# Patient Record
Sex: Male | Born: 1955 | Race: White | Hispanic: No | Marital: Married | Smoking: Never smoker
Health system: Southern US, Community
[De-identification: ages and names within clinical notes are randomized; demographics above are authoritative.]

## PROBLEM LIST (undated history)

## (undated) DIAGNOSIS — R05 Cough: Secondary | ICD-10-CM

## (undated) DIAGNOSIS — M79669 Pain in unspecified lower leg: Secondary | ICD-10-CM

## (undated) DIAGNOSIS — M791 Myalgia, unspecified site: Secondary | ICD-10-CM

## (undated) DIAGNOSIS — R233 Spontaneous ecchymoses: Secondary | ICD-10-CM

## (undated) DIAGNOSIS — K219 Gastro-esophageal reflux disease without esophagitis: Secondary | ICD-10-CM

## (undated) DIAGNOSIS — R059 Cough, unspecified: Secondary | ICD-10-CM

## (undated) DIAGNOSIS — R238 Other skin changes: Secondary | ICD-10-CM

## (undated) HISTORY — DX: Other skin changes: R23.8

## (undated) HISTORY — DX: Gastro-esophageal reflux disease without esophagitis: K21.9

## (undated) HISTORY — DX: Cough, unspecified: R05.9

## (undated) HISTORY — DX: Cough: R05

## (undated) HISTORY — DX: Myalgia, unspecified site: M79.10

## (undated) HISTORY — DX: Spontaneous ecchymoses: R23.3

## (undated) HISTORY — DX: Pain in unspecified lower leg: M79.669

## (undated) HISTORY — PX: HERNIA REPAIR: SHX51

---

## 2014-03-30 ENCOUNTER — Ambulatory Visit (INDEPENDENT_AMBULATORY_CARE_PROVIDER_SITE_OTHER): Payer: BC Managed Care – PPO | Admitting: Podiatrist

## 2014-03-30 ENCOUNTER — Encounter: Payer: Self-pay | Admitting: Podiatrist

## 2014-03-30 VITALS — BP 140/101 | HR 91 | Resp 18

## 2014-03-30 DIAGNOSIS — Q828 Other specified congenital malformations of skin: Secondary | ICD-10-CM

## 2014-03-30 DIAGNOSIS — M216X9 Other acquired deformities of unspecified foot: Secondary | ICD-10-CM

## 2014-03-30 NOTE — Patient Instructions (Signed)

## 2014-03-30 NOTE — Progress Notes (Signed)
   Subjective:    Patient ID: Johnny Harris, male    DOB: 08/09/1956, 58 y.o.   MRN: [id number]  HPI  Both my feet have some plantars warts and have been going on for about 6 years and I have used a razor to get them off and they are painful and sore and tender and burning and tingling and shooting pains up my leg and throbs and I have used Dr. Janee MornShull's pads    Review of Systems  HENT: Positive for sinus pressure.   Eyes: Positive for itching.  Musculoskeletal: Positive for gait problem.  Hematological: Bruises/bleeds easily.  All other systems reviewed and are negative.      Objective:   Physical Exam Patient is awake, alert, and oriented x 3.  In no acute distress.  Vascular status is intact with palpable pedal pulses at 2/4 DP and PT bilateral and capillary refill time within normal limits. Neurological sensation is also intact bilaterally via Semmes Weinstein monofilament at 5/5 sites. Light touch, vibratory sensation, Achilles tendon reflex is intact. Dermatological exam reveals skin color, turger and texture as normal. No open lesions present.  Musculature intact with dorsiflexion, plantarflexion, inversion, eversion.  Porokeratotic lesions present with a ground glass appearance and central core submet 5 right, and submet 3 left.  Hyperkeratotic callus present submet 1 left and left and right hallux    Assessment & Plan:  Porokeratotic lesions, plantar flexed metatarsals, bunion left  Plan:  Deep debridement of the lesions was carried out and packed with salinocaine and an aperture pad applied. He will be seen back in 3-4 weeks for continued treatment

## 2014-04-02 ENCOUNTER — Telehealth: Payer: Self-pay | Admitting: *Deleted

## 2014-04-02 NOTE — Telephone Encounter (Signed)
Tried to call patient and their was no answer at home and will try to call monday

## 2014-04-02 NOTE — Telephone Encounter (Signed)
I was up there Tuesday about my feet.  [facility]  I was needing a size 11.  Has she picked those up yet?

## 2014-04-05 NOTE — Telephone Encounter (Signed)
TRIED TO CALL PATIENT AGAIN TODAY AND LEFT A MESSAGE FOR HIM TO CALL AND LET ME KNOW WHEN HE WAS COMING. LISA

## 2014-04-20 ENCOUNTER — Ambulatory Visit (INDEPENDENT_AMBULATORY_CARE_PROVIDER_SITE_OTHER): Payer: BC Managed Care – PPO | Admitting: Podiatrist

## 2014-04-20 ENCOUNTER — Encounter: Payer: Self-pay | Admitting: Podiatrist

## 2014-04-20 VITALS — BP 116/79 | HR 75 | Resp 18

## 2014-04-20 DIAGNOSIS — B079 Viral wart, unspecified: Secondary | ICD-10-CM

## 2014-04-20 MED ORDER — IMIQUIMOD 5 % EX CREA: TOPICAL_CREAM | CUTANEOUS | Status: AC

## 2014-04-20 NOTE — Patient Instructions (Signed)

## 2014-04-20 NOTE — Progress Notes (Signed)
IT HAS COME BACK AND THE INSERTS ARE DOING OK  Patient presents today for followup of lesion sub-left foot and submetatarsal of the right foot. He states the areas have come back in are painful and symptomatic. He also requests pain medication at today's visit.  Objective: Neurovascular status is intact and unchanged. Well-circumscribed lesions are present submetatarsal 3 left foot and submetatarsal right 5.  the areas are painful and symptomatic with direct pressure and with compression. Skin tension lines are absent. They have a waxy type core present with a pinpoint area of bleeding noted bleeding me to suspect that might be verruca  lesions after all.  Assessment: Wart bilateral feet  Plan:: Debrided the areas and applied canthacur and a dressing. Gave him instructions for aftercare and a prescription for Aldara was called into his pharmacy. He was given instructions for its use. I will see him back in one month and we will recheck the lesions at that time.

## 2014-05-11 ENCOUNTER — Encounter: Payer: Self-pay | Admitting: Podiatrist

## 2014-05-11 ENCOUNTER — Ambulatory Visit (INDEPENDENT_AMBULATORY_CARE_PROVIDER_SITE_OTHER): Payer: BC Managed Care – PPO | Admitting: Podiatrist

## 2014-05-11 VITALS — BP 108/80 | HR 78 | Resp 18

## 2014-05-11 DIAGNOSIS — B079 Viral wart, unspecified: Secondary | ICD-10-CM

## 2014-05-11 NOTE — Progress Notes (Signed)
Patient presents today for followup of lesion sub third metatarsal-left foot and submetatarsal fifth of the right foot. He states the areas have come back in are painful and symptomatic. He has been applying the Aldara as instructed.  Objective: Neurovascular status is intact and unchanged. Well-circumscribed lesions are present submetatarsal 3 left foot and submetatarsal right 5. the areas are painful and symptomatic with direct pressure and with compression. Skin tension lines are absent. They have a waxy type core present with a pinpoint area of bleeding noted bleeding me to suspect that might be verruca lesions   Assessment: Wart bilateral feet   Plan:: Debrided the areas and applied canthacur and a dressing. Instructed him to continue using the Aldara. May be a candidate for solaraze gel at the next visit. I will see him back in 4 weeks for followup.

## 2014-06-08 ENCOUNTER — Ambulatory Visit: Payer: BC Managed Care – PPO | Admitting: Podiatrist

## 2014-06-15 ENCOUNTER — Ambulatory Visit (INDEPENDENT_AMBULATORY_CARE_PROVIDER_SITE_OTHER): Payer: BC Managed Care – PPO | Admitting: Podiatrist

## 2014-06-15 ENCOUNTER — Encounter: Payer: Self-pay | Admitting: Podiatrist

## 2014-06-15 VITALS — BP 164/96 | HR 89 | Resp 12

## 2014-06-15 DIAGNOSIS — B079 Viral wart, unspecified: Secondary | ICD-10-CM

## 2014-06-15 NOTE — Progress Notes (Signed)
Patient presents today for followup of lesion sub third metatarsal-left foot and submetatarsal fifth of the right foot. He states the areas of the left foot is improved however the one on the right foot is still painful and symptomatic. He has been applying the Aldara as instructed.  Objective: Neurovascular status is intact and unchanged. Well-circumscribed lesions are present submetatarsal right 5. the areas are painful and symptomatic with direct pressure and with compression. Skin tension lines are absent. They have a waxy type core present with a pinpoint area of bleeding noted bleeding me to suspect that might be verruca lesions  Assessment: Wart right foot Plan:: Debrided the areas and applied canthacur and a dressing. Instructed him to continue using the Aldara.

## 2014-07-27 ENCOUNTER — Ambulatory Visit (INDEPENDENT_AMBULATORY_CARE_PROVIDER_SITE_OTHER): Payer: BC Managed Care – PPO | Admitting: Podiatrist

## 2014-07-27 ENCOUNTER — Encounter: Payer: Self-pay | Admitting: Podiatrist

## 2014-07-27 VITALS — BP 154/85 | HR 82 | Resp 12

## 2014-07-27 DIAGNOSIS — Q828 Other specified congenital malformations of skin: Secondary | ICD-10-CM

## 2014-07-27 DIAGNOSIS — B079 Viral wart, unspecified: Secondary | ICD-10-CM

## 2014-07-27 NOTE — Patient Instructions (Signed)
Salicylic Acid topical gel, cream, lotion, solution What is this medicine? SALICYCLIC ACID (SAL i SIL ik AS id) breaks down layers of thick skin. It is used to treat common and plantar warts, psoriasis, calluses, and corns. It is also used to treat or to prevent acne. This medicine may be used for other purposes; ask your health care provider or pharmacist if you have questions. COMMON BRAND NAME(S): Akurza, Clear Away Liquid, Compound W, Corn/Callus Remover, Dermarest Psoriasis Moisturizer, Dermarest Psoriasis Overnight Treatment, Dermarest Psoriasis Scalp Treatment, Dermarest Psoriasis Skin Treatment, DuoFilm Wart Remover, Gordofilm, Hydrisalic, Keralyt, Neutrogena Acne Wash, Occlusal-HP, RE SA, SalAC, Salactic Film, Salacyn, Salex, Salitop, Scalpicin 2 in 1 Anti-Dandruff, UltraSal-ER, VIRASAL, Wart-Off What should I tell my health care provider before I take this medicine? They need to know if you have any of these conditions: -child with chickenpox, the flu, or other viral infection -kidney disease -liver disease -an unusual or allergic reaction to salicylic acid, other medicines, foods, dyes, or preservatives -pregnant or trying to get pregnant -breast-feeding How should I use this medicine? This medicine is for external use only. Follow the directions on the label. Do not apply to raw or irritated skin. Avoid getting medicine in your eyes, lips, nose, mouth, or other sensitive areas. Use this medicine at regular intervals. Do not use more often than directed. Talk to your pediatrician regarding the use of this medicine in children. Special care may be needed. This medicine is not approved for use in children under 2 years old. Overdosage: If you think you have taken too much of this medicine contact a poison control center or emergency room at once. NOTE: This medicine is only for you. Do not share this medicine with others. What if I miss a dose? If you miss a dose, use it as soon as you  can. If it is almost time for your next dose, use only that dose. Do not use double or extra doses. What may interact with this medicine? -medicines that change urine pH like ammonium chloride, sodium bicarbonate, and others -medicines that treat or prevent blood clots like warfarin -methotrexate -pyrazinamide -some medicines for diabetes -some medicines for gout -steroid medicines like prednisone or cortisone This list may not describe all possible interactions. Give your health care provider a list of all the medicines, herbs, non-prescription drugs, or dietary supplements you use. Also tell them if you smoke, drink alcohol, or use illegal drugs. Some items may interact with your medicine. What should I watch for while using this medicine? Tell your doctor is your symptoms do not get better or if they get worse. This medicine can make you more sensitive to the sun. Keep out of the sun. If you cannot avoid being in the sun, wear protective clothing and use sunscreen. Do not use sun lamps or tanning beds/booths. Use of this medicine in children under 12 years or in patients with kidney or liver disease may increase the risk of serious side effects. These patients should not use this medicine over large areas of skin. If you notice symptoms such as nausea, vomiting, dizziness, loss of hearing, ringing in the ears, unusual weakness or tiredness, fast or labored breathing, diarrhea, or confusion, stop using this medicine and contact your doctor or health care professional. What side effects may I notice from receiving this medicine? Side effects that you should report to your doctor or health care professional as soon as possible: -allergic reactions like skin rash, itching or hives, swelling of the face,   lips, or tongue Side effects that usually do not require medical attention (report to your doctor or health care professional if they continue or are bothersome): -skin irritation This list may not  describe all possible side effects. Call your doctor for medical advice about side effects. You may report side effects to FDA at 1-800-FDA-1088. Where should I keep my medicine? Keep out of the reach of children. Store at room temperature between 15 and 30 degrees C (59 and 86 degrees F). Do not freeze. Throw away any unused medicine after the expiration date. NOTE: This sheet is a summary. It may not cover all possible information. If you have questions about this medicine, talk to your doctor, pharmacist, or health care provider.  2015, [employer]vier/Gold Standard. (2008-06-04 13:36:20)  

## 2014-07-27 NOTE — Progress Notes (Signed)
Patient presents today for followup of lesion sub third metatarsal-left foot and submetatarsal fifth of the right foot. He states the areas of the left foot is improved however the one on the right foot is still painful and symptomatic.   Objective: Neurovascular status is intact and unchanged. Well-circumscribed lesions are present submetatarsal right 5. the areas are painful and symptomatic with direct pressure and with compression. Skin tension lines are absent. They have a waxy type core present with a pinpoint area of bleeding noted bleeding me to suspect that might be verruca lesions.  Hard calluses are present bilateral halluces as well.  Assessment: Wart right foot   Plan:: Debrided the areas and applied canthacur and a dressing. Instructed him to continue using the over-the-counter wart remover acid

## 2014-09-06 ENCOUNTER — Ambulatory Visit (INDEPENDENT_AMBULATORY_CARE_PROVIDER_SITE_OTHER): Payer: BC Managed Care – PPO

## 2014-09-06 VITALS — BP 159/69 | HR 82 | Resp 12

## 2014-09-06 DIAGNOSIS — Q828 Other specified congenital malformations of skin: Secondary | ICD-10-CM

## 2014-09-06 DIAGNOSIS — M79673 Pain in unspecified foot: Secondary | ICD-10-CM

## 2014-09-06 DIAGNOSIS — B070 Plantar wart: Secondary | ICD-10-CM

## 2014-09-06 NOTE — Progress Notes (Signed)
   Subjective:    Patient ID: Johnny Harris, male    DOB: 07/30/1956, 58 y.o.   MRN: [id number]  HPI  ''B/L WARTS CAME BACK AND MAKING FEET HURT.''  Review of Systems no new findings or systemic changes noted     Objective:   Physical Exam This is a 58 year old white male well-developed well-nourished oriented 3 presents at this time with continued pain of keratotic lesion or what appeared be verruca sub-lesion sub-fifth MTP area right third MTP her distal to the MTP area left. Also pinch callus is diffuse keratoses of the first MTP area left. Her fluoroscopic extremity objective findings reveal pat vascular status to be intact pedal pulses palpable DP +2 over 4 bilateral PT one over 4 bilateral Refill time 3 seconds all digits epicritic and proprioceptive sensations intact and symmetric bilateral there is normal plantar response DTRs not noted dermatologic his skin color pigment normal hair growth absent nails criptotic incurvated and friable patient is multiple keratoses particular sub-5 right the keratotic lesion well-developed painful on direct lateral compression well-circumscribed. There is a second lesion sub-third and tarsal digital sulcus area on the left foot also painful on direct lateral compression with well-healed circumscribed border interruption of skin lines consistent with verruca versus porokeratosis. No open wounds no ulcers no secondary infections temperature warm to cool turgor diminished mild edema noted bilateral. Patient is been on carotid treatments that have these debrided pack in the past packed with Cantharone her cancer care patient is also been on Aldara has had continued recurrence and recalcitrant verruca.       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 this time both lesions are debrided and packed with 60% salicylic acid under occlusion for 24 hours a written instructions for topical salicylic acid or  occlusal is given at this time we'll apply daily for 2-3 week duration as instructed to both lesions maintain appropriate accommodative padding and cushioning recheck in one month for further follow-up and he continues to have difficulty make consider mother more invasive options excision curettage laser excisions may need to be discussed if symptoms persist or fail to resolve again this time we use occlusal instruction sheet and use duct tape and salicylic acid daily for 7 up to 21 day regimen of treatment area and tinea if any complications occur any signs of infection discharge or drainage contact us immediately should something like that were to happen  Alvan Dameichard Yannely Kintzel DPM

## 2014-09-06 NOTE — Patient Instructions (Signed)

## 2014-10-04 ENCOUNTER — Ambulatory Visit: Payer: BC Managed Care – PPO

## 2015-07-04 ENCOUNTER — Inpatient Hospital Stay (HOSPITAL_COMMUNITY): Payer: BLUE CROSS/BLUE SHIELD

## 2015-07-04 ENCOUNTER — Inpatient Hospital Stay (HOSPITAL_COMMUNITY)
Admission: EM | Admit: 2015-07-04 | Discharge: 2015-07-16 | DRG: 871 | Disposition: E | Payer: BLUE CROSS/BLUE SHIELD | Source: Other Acute Inpatient Hospital | Attending: Pulmonary Disease | Admitting: Pulmonary Disease

## 2015-07-04 DIAGNOSIS — Z452 Encounter for adjustment and management of vascular access device: Secondary | ICD-10-CM | POA: Insufficient documentation

## 2015-07-04 DIAGNOSIS — K219 Gastro-esophageal reflux disease without esophagitis: Secondary | ICD-10-CM | POA: Diagnosis present

## 2015-07-04 DIAGNOSIS — Z6831 Body mass index (BMI) 31.0-31.9, adult: Secondary | ICD-10-CM | POA: Diagnosis not present

## 2015-07-04 DIAGNOSIS — E669 Obesity, unspecified: Secondary | ICD-10-CM | POA: Diagnosis present

## 2015-07-04 DIAGNOSIS — I509 Heart failure, unspecified: Secondary | ICD-10-CM | POA: Diagnosis not present

## 2015-07-04 DIAGNOSIS — A419 Sepsis, unspecified organism: Secondary | ICD-10-CM | POA: Diagnosis present

## 2015-07-04 DIAGNOSIS — K7200 Acute and subacute hepatic failure without coma: Secondary | ICD-10-CM | POA: Diagnosis present

## 2015-07-04 DIAGNOSIS — R911 Solitary pulmonary nodule: Secondary | ICD-10-CM | POA: Diagnosis present

## 2015-07-04 DIAGNOSIS — G9340 Encephalopathy, unspecified: Secondary | ICD-10-CM | POA: Diagnosis present

## 2015-07-04 DIAGNOSIS — R197 Diarrhea, unspecified: Secondary | ICD-10-CM | POA: Diagnosis not present

## 2015-07-04 DIAGNOSIS — J9601 Acute respiratory failure with hypoxia: Secondary | ICD-10-CM | POA: Diagnosis present

## 2015-07-04 DIAGNOSIS — E43 Unspecified severe protein-calorie malnutrition: Secondary | ICD-10-CM | POA: Diagnosis present

## 2015-07-04 DIAGNOSIS — R0600 Dyspnea, unspecified: Secondary | ICD-10-CM | POA: Diagnosis not present

## 2015-07-04 DIAGNOSIS — F17210 Nicotine dependence, cigarettes, uncomplicated: Secondary | ICD-10-CM | POA: Diagnosis present

## 2015-07-04 DIAGNOSIS — Z66 Do not resuscitate: Secondary | ICD-10-CM | POA: Diagnosis not present

## 2015-07-04 DIAGNOSIS — R6521 Severe sepsis with septic shock: Secondary | ICD-10-CM | POA: Diagnosis present

## 2015-07-04 DIAGNOSIS — I429 Cardiomyopathy, unspecified: Secondary | ICD-10-CM | POA: Diagnosis present

## 2015-07-04 DIAGNOSIS — D65 Disseminated intravascular coagulation [defibrination syndrome]: Secondary | ICD-10-CM | POA: Diagnosis present

## 2015-07-04 DIAGNOSIS — K567 Ileus, unspecified: Secondary | ICD-10-CM | POA: Diagnosis not present

## 2015-07-04 DIAGNOSIS — N170 Acute kidney failure with tubular necrosis: Secondary | ICD-10-CM | POA: Diagnosis present

## 2015-07-04 DIAGNOSIS — Z9911 Dependence on respirator [ventilator] status: Secondary | ICD-10-CM | POA: Diagnosis not present

## 2015-07-04 DIAGNOSIS — E872 Acidosis: Secondary | ICD-10-CM | POA: Diagnosis present

## 2015-07-04 DIAGNOSIS — N179 Acute kidney failure, unspecified: Secondary | ICD-10-CM

## 2015-07-04 DIAGNOSIS — E875 Hyperkalemia: Secondary | ICD-10-CM | POA: Diagnosis present

## 2015-07-04 DIAGNOSIS — J189 Pneumonia, unspecified organism: Secondary | ICD-10-CM | POA: Diagnosis present

## 2015-07-04 DIAGNOSIS — E162 Hypoglycemia, unspecified: Secondary | ICD-10-CM | POA: Diagnosis present

## 2015-07-04 DIAGNOSIS — J9600 Acute respiratory failure, unspecified whether with hypoxia or hypercapnia: Secondary | ICD-10-CM | POA: Diagnosis not present

## 2015-07-04 DIAGNOSIS — I480 Paroxysmal atrial fibrillation: Secondary | ICD-10-CM | POA: Diagnosis present

## 2015-07-04 DIAGNOSIS — Z880 Allergy status to penicillin: Secondary | ICD-10-CM

## 2015-07-04 DIAGNOSIS — J984 Other disorders of lung: Secondary | ICD-10-CM | POA: Diagnosis not present

## 2015-07-04 DIAGNOSIS — N19 Unspecified kidney failure: Secondary | ICD-10-CM | POA: Insufficient documentation

## 2015-07-04 DIAGNOSIS — R34 Anuria and oliguria: Secondary | ICD-10-CM | POA: Diagnosis present

## 2015-07-04 LAB — COMPREHENSIVE METABOLIC PANEL
ALBUMIN: 2.3 g/dL — AB (ref 3.5–5.0)
ALT: 153 U/L — ABNORMAL HIGH (ref 17–63)
ANION GAP: 14 (ref 5–15)
AST: 488 U/L — ABNORMAL HIGH (ref 15–41)
Alkaline Phosphatase: 35 U/L — ABNORMAL LOW (ref 38–126)
BILIRUBIN TOTAL: 2.6 mg/dL — AB (ref 0.3–1.2)
BUN: 31 mg/dL — ABNORMAL HIGH (ref 6–20)
CO2: 20 mmol/L — ABNORMAL LOW (ref 22–32)
Calcium: 6 mg/dL — CL (ref 8.9–10.3)
Chloride: 102 mmol/L (ref 101–111)
Creatinine, Ser: 4.05 mg/dL — ABNORMAL HIGH (ref 0.61–1.24)
GFR calc Af Amer: 17 mL/min — ABNORMAL LOW (ref >60)
GFR calc non Af Amer: 15 mL/min — ABNORMAL LOW (ref >60)
GLUCOSE: 123 mg/dL — AB (ref 65–99)
POTASSIUM: 6.3 mmol/L — AB (ref 3.5–5.1)
SODIUM: 136 mmol/L (ref 135–145)
TOTAL PROTEIN: 4.7 g/dL — AB (ref 6.5–8.1)

## 2015-07-04 LAB — CBC
HEMATOCRIT: 54.6 % — AB (ref 39.0–52.0)
HEMOGLOBIN: 17.6 g/dL — AB (ref 13.0–17.0)
MCH: 31.5 pg (ref 26.0–34.0)
MCHC: 32.2 g/dL (ref 30.0–36.0)
MCV: 97.7 fL (ref 78.0–100.0)
Platelets: 18 10*3/uL — CL (ref 150–400)
RBC: 5.59 MIL/uL (ref 4.22–5.81)
RDW: 16.9 % — ABNORMAL HIGH (ref 11.5–15.5)
WBC: 26.6 10*3/uL — ABNORMAL HIGH (ref 4.0–10.5)

## 2015-07-04 LAB — POCT I-STAT 3, ART BLOOD GAS (G3+)
ACID-BASE DEFICIT: 6 mmol/L — AB (ref 0.0–2.0)
Acid-base deficit: 8 mmol/L — ABNORMAL HIGH (ref 0.0–2.0)
BICARBONATE: 19.9 meq/L — AB (ref 20.0–24.0)
Bicarbonate: 20 mEq/L (ref 20.0–24.0)
O2 SAT: 99 %
O2 Saturation: 99 %
PO2 ART: 168 mmHg — AB (ref 80.0–100.0)
TCO2: 21 mmol/L (ref 0–100)
TCO2: 21 mmol/L (ref 0–100)
pCO2 arterial: 48 mmHg — ABNORMAL HIGH (ref 35.0–45.0)
pCO2 arterial: 47.1 mmHg — ABNORMAL HIGH (ref 35.0–45.0)
pH, Arterial: 7.227 — ABNORMAL LOW (ref 7.350–7.450)
pH, Arterial: 7.247 — ABNORMAL LOW (ref 7.350–7.450)
pO2, Arterial: 189 mmHg — ABNORMAL HIGH (ref 80.0–100.0)

## 2015-07-04 LAB — LACTIC ACID, PLASMA
LACTIC ACID, VENOUS: 18.5 mmol/L — AB (ref 0.5–2.0)
LACTIC ACID, VENOUS: 4.5 mmol/L — AB (ref 0.5–2.0)

## 2015-07-04 LAB — DIC (DISSEMINATED INTRAVASCULAR COAGULATION) PANEL
FIBRINOGEN: 291 mg/dL (ref 204–475)
INR: 2.88 — AB (ref 0.00–1.49)
PROTHROMBIN TIME: 29.7 s — AB (ref 11.6–15.2)

## 2015-07-04 LAB — MRSA PCR SCREENING: MRSA BY PCR: NEGATIVE

## 2015-07-04 LAB — DIC (DISSEMINATED INTRAVASCULAR COAGULATION)PANEL
Platelets: 18 10*3/uL — CL (ref 150–400)
Smear Review: NONE SEEN
aPTT: 51 seconds — ABNORMAL HIGH (ref 24–37)

## 2015-07-04 LAB — GLUCOSE, CAPILLARY
Glucose-Capillary: 48 mg/dL — ABNORMAL LOW (ref 65–99)
Glucose-Capillary: 143 mg/dL — ABNORMAL HIGH (ref 65–99)
Glucose-Capillary: 142 mg/dL — ABNORMAL HIGH (ref 65–99)

## 2015-07-04 LAB — APTT: APTT: 54 s — AB (ref 24–37)

## 2015-07-04 LAB — CORTISOL: Cortisol, Plasma: 68.4 ug/dL

## 2015-07-04 LAB — PROCALCITONIN: Procalcitonin: >175 ng/mL

## 2015-07-04 LAB — PROTIME-INR
INR: 2.89 — AB (ref 0.00–1.49)
PROTHROMBIN TIME: 29.8 s — AB (ref 11.6–15.2)

## 2015-07-04 MED ORDER — SODIUM CHLORIDE 0.9 % FOR CRRT
INTRAVENOUS_CENTRAL | Status: DC | PRN
  Filled 2015-07-04: qty 1000

## 2015-07-04 MED ORDER — SODIUM BICARBONATE 8.4 % IV SOLN
50.0000 meq | Freq: Once | INTRAVENOUS | Status: AC
  Administered 2015-07-04: 50 meq via INTRAVENOUS
  Filled 2015-07-04: qty 50

## 2015-07-04 MED ORDER — DEXTROSE 50 % IV SOLN
50.0000 mL | Freq: Once | INTRAVENOUS | Status: AC
  Administered 2015-07-04: 25 mL via INTRAVENOUS
  Filled 2015-07-04: qty 50

## 2015-07-04 MED ORDER — DEXTROSE 50 % IV SOLN
50.0000 mL | Freq: Once | INTRAVENOUS | Status: AC
  Administered 2015-07-04: 50 mL via INTRAVENOUS
  Filled 2015-07-04: qty 50

## 2015-07-04 MED ORDER — VANCOMYCIN HCL IN DEXTROSE 1-5 GM/200ML-% IV SOLN
1000.0000 mg | Freq: Once | INTRAVENOUS | Status: DC
  Filled 2015-07-04: qty 200

## 2015-07-04 MED ORDER — SODIUM CHLORIDE 0.9 % IV SOLN
Freq: Once | INTRAVENOUS | Status: AC
  Administered 2015-07-04: 14:00:00 via INTRAVENOUS

## 2015-07-04 MED ORDER — DEXTROSE 5 % IV SOLN
500.0000 mg | Freq: Three times a day (TID) | INTRAVENOUS | Status: DC
  Filled 2015-07-04 (×2): qty 0.5

## 2015-07-04 MED ORDER — PANTOPRAZOLE SODIUM 40 MG IV SOLR
40.0000 mg | Freq: Every day | INTRAVENOUS | Status: DC
  Administered 2015-07-04 – 2015-07-05 (×2): 40 mg via INTRAVENOUS
  Filled 2015-07-04 (×2): qty 40

## 2015-07-04 MED ORDER — CALCIUM CHLORIDE 10 % IV SOLN
1.0000 g | Freq: Once | INTRAVENOUS | Status: AC
  Administered 2015-07-04: 1 g via INTRAVENOUS
  Filled 2015-07-04 (×2): qty 10

## 2015-07-04 MED ORDER — ANTISEPTIC ORAL RINSE SOLUTION (CORINZ)
7.0000 mL | Freq: Four times a day (QID) | OROMUCOSAL | Status: DC
  Administered 2015-07-04: 7 mL via OROMUCOSAL

## 2015-07-04 MED ORDER — PRISMASOL BGK 4/2.5 32-4-2.5 MEQ/L IV SOLN
INTRAVENOUS | Status: DC
  Administered 2015-07-04 – 2015-07-05 (×6): via INTRAVENOUS_CENTRAL
  Filled 2015-07-04 (×12): qty 5000

## 2015-07-04 MED ORDER — CHLORHEXIDINE GLUCONATE 0.12% ORAL RINSE (MEDLINE KIT)
15.0000 mL | Freq: Two times a day (BID) | OROMUCOSAL | Status: DC
Start: 2015-07-04 — End: 2015-07-07
  Administered 2015-07-04 – 2015-07-06 (×5): 15 mL via OROMUCOSAL

## 2015-07-04 MED ORDER — SODIUM CHLORIDE 0.9 % IV SOLN: 1500.0000 mg | INTRAVENOUS | Status: DC

## 2015-07-04 MED ORDER — HYDROCORTISONE NA SUCCINATE PF 100 MG IJ SOLR
50.0000 mg | Freq: Four times a day (QID) | INTRAMUSCULAR | Status: DC
  Administered 2015-07-04 – 2015-07-05 (×4): 50 mg via INTRAVENOUS
  Filled 2015-07-04 (×2): qty 1
  Filled 2015-07-04: qty 2
  Filled 2015-07-04: qty 1
  Filled 2015-07-04 (×3): qty 2
  Filled 2015-07-04: qty 1

## 2015-07-04 MED ORDER — DEXTROSE IN LACTATED RINGERS 5 % IV SOLN
INTRAVENOUS | Status: DC
  Administered 2015-07-04: 125 mL/h via INTRAVENOUS
  Administered 2015-07-04: 15:00:00 via INTRAVENOUS

## 2015-07-04 MED ORDER — FENTANYL CITRATE (PF) 100 MCG/2ML IJ SOLN
100.0000 ug | INTRAMUSCULAR | Status: DC | PRN
  Administered 2015-07-04 (×2): 100 ug via INTRAVENOUS
  Filled 2015-07-04 (×2): qty 2

## 2015-07-04 MED ORDER — DEXTROSE 5 % IV SOLN
1.0000 g | Freq: Three times a day (TID) | INTRAVENOUS | Status: DC
  Administered 2015-07-04 – 2015-07-05 (×2): 1 g via INTRAVENOUS
  Filled 2015-07-04 (×4): qty 1

## 2015-07-04 MED ORDER — PRISMASOL BGK 4/2.5 32-4-2.5 MEQ/L IV SOLN
INTRAVENOUS | Status: DC
  Administered 2015-07-04 – 2015-07-05 (×2): via INTRAVENOUS_CENTRAL
  Filled 2015-07-04 (×4): qty 5000

## 2015-07-04 MED ORDER — DEXTROSE 5 % IV SOLN
2.0000 g | Freq: Once | INTRAVENOUS | Status: AC
  Administered 2015-07-04: 2 g via INTRAVENOUS
  Filled 2015-07-04: qty 2

## 2015-07-04 MED ORDER — ALBUTEROL SULFATE (2.5 MG/3ML) 0.083% IN NEBU
2.5000 mg | INHALATION_SOLUTION | RESPIRATORY_TRACT | Status: DC
  Administered 2015-07-04 – 2015-07-06 (×14): 2.5 mg via RESPIRATORY_TRACT
  Filled 2015-07-04 (×14): qty 3

## 2015-07-04 MED ORDER — SODIUM POLYSTYRENE SULFONATE 15 GM/60ML PO SUSP
30.0000 g | Freq: Once | ORAL | Status: AC
  Administered 2015-07-04: 30 g
  Filled 2015-07-04: qty 120

## 2015-07-04 MED ORDER — VANCOMYCIN HCL IN DEXTROSE 1-5 GM/200ML-% IV SOLN
1000.0000 mg | INTRAVENOUS | Status: DC
  Administered 2015-07-05 – 2015-07-06 (×2): 1000 mg via INTRAVENOUS
  Filled 2015-07-04 (×2): qty 200

## 2015-07-04 MED ORDER — LEVOFLOXACIN IN D5W 750 MG/150ML IV SOLN: 750.0000 mg | Freq: Once | INTRAVENOUS | Status: DC

## 2015-07-04 MED ORDER — VASOPRESSIN 20 UNIT/ML IV SOLN
0.0300 [IU]/min | INTRAVENOUS | Status: DC
  Administered 2015-07-04 – 2015-07-06 (×3): 0.03 [IU]/min via INTRAVENOUS
  Filled 2015-07-04 (×4): qty 2

## 2015-07-04 MED ORDER — NOREPINEPHRINE BITARTRATE 1 MG/ML IV SOLN
0.0000 ug/min | INTRAVENOUS | Status: DC
  Administered 2015-07-04: 5 ug/min via INTRAVENOUS
  Administered 2015-07-05: 15 ug/min via INTRAVENOUS
  Filled 2015-07-04 (×4): qty 16

## 2015-07-04 MED ORDER — SODIUM CHLORIDE 0.9 % IV SOLN: Freq: Once | INTRAVENOUS | Status: DC

## 2015-07-04 MED ORDER — DEXTROSE 5 % IV SOLN
0.0000 ug/min | INTRAVENOUS | Status: DC
  Administered 2015-07-04: 20 ug/min via INTRAVENOUS
  Filled 2015-07-04 (×2): qty 4

## 2015-07-04 MED ORDER — PRISMASOL BGK 4/2.5 32-4-2.5 MEQ/L IV SOLN
INTRAVENOUS | Status: DC
  Administered 2015-07-04: 23:00:00 via INTRAVENOUS_CENTRAL
  Filled 2015-07-04 (×2): qty 5000

## 2015-07-04 MED ORDER — INSULIN ASPART 100 UNIT/ML ~~LOC~~ SOLN
10.0000 [IU] | Freq: Once | SUBCUTANEOUS | Status: AC
  Administered 2015-07-04: 10 [IU] via INTRAVENOUS

## 2015-07-04 MED ORDER — SODIUM CHLORIDE 0.9 % IV SOLN
2500.0000 mg | Freq: Once | INTRAVENOUS | Status: AC
  Administered 2015-07-04: 2500 mg via INTRAVENOUS
  Filled 2015-07-04: qty 2500

## 2015-07-04 MED ORDER — FENTANYL CITRATE (PF) 100 MCG/2ML IJ SOLN
100.0000 ug | INTRAMUSCULAR | Status: DC | PRN
  Administered 2015-07-05 (×3): 100 ug via INTRAVENOUS
  Filled 2015-07-04 (×3): qty 2

## 2015-07-04 MED ORDER — DEXTROSE 50 % IV SOLN
INTRAVENOUS | Status: AC
Start: 2015-07-04 — End: 2015-07-05
  Filled 2015-07-04: qty 50

## 2015-07-04 MED ORDER — DEXTROSE 50 % IV SOLN
INTRAVENOUS | Status: AC
  Administered 2015-07-04: 25 mL via INTRAVENOUS
  Filled 2015-07-04: qty 50

## 2015-07-04 MED ORDER — HEPARIN SODIUM (PORCINE) 1000 UNIT/ML DIALYSIS
1000.0000 [IU] | INTRAMUSCULAR | Status: DC | PRN
  Filled 2015-07-04: qty 6

## 2015-07-04 MED ORDER — LEVOFLOXACIN IN D5W 500 MG/100ML IV SOLN: 500.0000 mg | INTRAVENOUS | Status: DC

## 2015-07-04 MED ORDER — VITAMIN K1 10 MG/ML IJ SOLN
10.0000 mg | Freq: Once | INTRAVENOUS | Status: AC
  Administered 2015-07-04: 10 mg via INTRAVENOUS
  Filled 2015-07-04: qty 1

## 2015-07-04 MED ORDER — DEXTROSE 5 % IV SOLN
INTRAVENOUS | Status: DC
  Administered 2015-07-04 – 2015-07-06 (×5): via INTRAVENOUS
  Filled 2015-07-04 (×9): qty 150

## 2015-07-04 MED ORDER — LEVOFLOXACIN IN D5W 750 MG/150ML IV SOLN
750.0000 mg | Freq: Once | INTRAVENOUS | Status: AC
  Administered 2015-07-04: 750 mg via INTRAVENOUS
  Filled 2015-07-04: qty 150

## 2015-07-04 MED ORDER — LEVOFLOXACIN IN D5W 250 MG/50ML IV SOLN
250.0000 mg | INTRAVENOUS | Status: DC
  Filled 2015-07-04: qty 50

## 2015-07-04 NOTE — Procedures (Signed)
Hemodialysis Insertion Procedure Note Johnny Harris [id number] [birth year][employer] Sealsure: Insertion of Hemodialysis Catheter Type: 3 port  Indications: Hemodialysis   Procedure Details Consent: Risks of procedure as well as the alternatives and risks of each were explained to the (patient/caregiver).  Consent for procedure obtained. Time Out: Verified patient identification, verified procedure, site/side was marked, verified correct patient position, special equipment/implants available, medications/allergies/relevent history reviewed, required imaging and test results available.  Performed  Maximum sterile technique was used including antiseptics, cap, gloves, gown, hand hygiene, mask and sheet. Skin prep: Chlorhexidine; local anesthetic administered A antimicrobial bonded/coated triple lumen catheter was placed in the right internal jugular vein using the Seldinger technique. Ultrasound guidance used.Yes.   Catheter placed to 20 cm. Blood aspirated via all 3 ports and then flushed x 3. Line sutured x 2 and dressing applied.  Evaluation Blood flow good Complications: No apparent complications Patient did tolerate procedure well. Chest X-ray ordered to verify placement.  CXR: pending.  Joneen Roach, AGACNP-BC [facility] Pulmonology/Critical Care Pager [phone] or [phone]  08-02-2015 10:17 PM

## 2015-07-04 NOTE — Procedures (Signed)
Arterial Catheter Insertion Procedure Note Haig Gerardo [id number] 1956-01-13  Procedure: Insertion of Arterial Catheter  Indications: Blood pressure monitoring and Frequent blood sampling  Procedure Details Consent: Unable to obtain consent because of emergent medical necessity. Time Out: Verified patient identification, verified procedure, site/side was marked, verified correct patient position, special equipment/implants available, medications/allergies/relevent history reviewed, required imaging and test results available.  Performed  Maximum sterile technique was used including antiseptics, cap, gloves, gown, hand hygiene, mask and sheet. Skin prep: Chlorhexidine; local anesthetic administered 20 gauge catheter was inserted into right radial artery using the Seldinger technique.  Evaluation Blood flow good; BP tracing good. Complications: No apparent complications.   Koren Bound 07/10/2015

## 2015-07-04 NOTE — Procedures (Signed)
Central Venous Catheter Insertion Procedure Note Johnny Harris [id number] 29-Sep-1956  Procedure: Insertion of Central Venous Catheter Indications: Assessment of intravascular volume, Drug and/or fluid administration and Frequent blood sampling  Procedure Details Consent: Unable to obtain consent because of emergent medical necessity. Time Out: Verified patient identification, verified procedure, site/side was marked, verified correct patient position, special equipment/implants available, medications/allergies/relevent history reviewed, required imaging and test results available.  Performed  Maximum sterile technique was used including antiseptics, cap, gloves, gown, hand hygiene, mask and sheet. Skin prep: Chlorhexidine; local anesthetic administered A antimicrobial bonded/coated triple lumen catheter was placed in the left internal jugular vein using the Seldinger technique.  Evaluation Blood flow good Complications: No apparent complications Patient did tolerate procedure well. Chest X-ray ordered to verify placement.  CXR: pending.  U/S used in placement.  YACOUB,WESAM 06/24/2015, 4:16 PM

## 2015-07-04 NOTE — Progress Notes (Signed)
eLink Physician-Brief Progress Note Patient Name: Johnny Harris DOB: 02-Jun-1956 MRN: [id number]   Date of Service  07/01/2015  HPI/Events of Note  Nurse requests Flexiseal.   eICU Interventions  Will order Flexiseal.      Intervention Category Minor Interventions: Routine modifications to care plan (e.g. PRN medications for pain, fever)  Lenell Antu 07/12/2015, 9:13 PM

## 2015-07-04 NOTE — Progress Notes (Signed)
Sputum sample obtained and sent down to main lab without complications.  

## 2015-07-04 NOTE — Consult Note (Signed)
Requesting Physician:  Dr. Molli Knock Reason for Consult:  Acute renal failure, hyperkalemia, lactic acidosis HPI: The patient is a 59 y.o. year-old WM with background of tobacco use, GERD who presented to [facility] with cough, AMS, hypotension. Had felt bad for about a week. Required intubation for resp failure. ATB's started along w/IVF. Imaging studies - bilat pulm infiltrates. D/t progressive hypoxemia, development of diffuse mottling - transferred to [facility] for further management of septic shock, MODS, renal failure, metabolic acidosis, profound thrombocytopenia. Family was informed earlier today of pt's progressive deterioration but unable to deal with issues related to code status so for now pt to receive full measure of support. We are asked to see d/t oligoanuric renal failure, hyperkalemia. Pt currently on pressors, K 6.3, creatinine 4, no UOP. Is on bicarb drip.   Past Medical History  Diagnosis Date  . Muscle pain   . Calf pain     with walking  . Cough   . GERD (gastroesophageal reflux disease)   . Bruises easily      Past Surgical History  Procedure Laterality Date  . Hernia repair      Family History: No family history on file. Social History:  reports that he has never smoked. He has never used smokeless tobacco. He reports that he does not drink alcohol or use illicit drugs.   Allergies  Allergen Reactions  . Penicillins     Childhood allergy    Home medications: Prior to Admission medications   Medication Sig Start Date End Date Taking? Authorizing Provider  naproxen sodium (ANAPROX) 220 MG tablet Take 220 mg by mouth 2 (two) times daily as needed (pain, headache).   Yes Historical Provider, MD  RaNITidine HCl (ACID CONTROL PO) Take 1 tablet by mouth daily as needed (acid reflux).   Yes Historical Provider, MD  imiquimod (ALDARA) 5 % cream Apply topically 3 (three) times a week. Patient not taking: Reported on July 26, 2015 04/20/14   Delories Heinz, DPM     Inpatient medications: . sodium chloride   Intravenous Once  . albuterol  2.5 mg Nebulization Q4H  . antiseptic oral rinse  7 mL Mouth Rinse QID  . aztreonam  500 mg Intravenous 3 times per day  . calcium chloride  1 g Intravenous Once  . chlorhexidine gluconate  15 mL Mouth Rinse BID  . dextrose      . hydrocortisone sod succinate (SOLU-CORTEF) inj  50 mg Intravenous Q6H  . [START ON 07/06/2015] levofloxacin (LEVAQUIN) IV  500 mg Intravenous Q48H  . pantoprazole (PROTONIX) IV  40 mg Intravenous QHS  . phytonadione (VITAMIN K) IV  10 mg Intravenous Once  . [START ON 07/06/2015] vancomycin  1,500 mg Intravenous Q48H   No current facility-administered medications on file prior to encounter.   Current Outpatient Prescriptions on File Prior to Encounter  Medication Sig Dispense Refill  . imiquimod (ALDARA) 5 % cream Apply topically 3 (three) times a week. (Patient not taking: Reported on 2015-07-26) 12 each 0  Infusions  . norepinephrine (LEVOPHED) Adult infusion 20 mcg/min (2015/07/26 1312)  .  sodium bicarbonate  infusion 1000 mL 125 mL/hr at 07/26/2015 1650  . vasopressin (PITRESSIN) infusion - *FOR SHOCK* 0.03 Units/min (July 26, 2015 1415)   Review of Systems Unobtainable as pt intubated  Physical Exam:  BP 149/125 mmHg  Pulse 123  Temp(Src) 103.7 F (39.8 C) (Rectal)  Resp 25  Ht 6' (1.829 m)  Wt 103.4 kg (227 lb 15.3 oz)  BMI 30.91 kg/m2  SpO2 98% Gen: Intubated WM, mottled from mid chest to toes ETT in place Pupils fixed Chest: Regular S1S2 rate 120's Lungs with bilateral coarse rhonchi Abdomen: soft, distended, quiet, mottled Ext: Mottled extremities, cold Neuro: Unresponsive  Labs: Basic Metabolic Panel:  Recent Labs Lab 07/29/2015 1452  NA 136  K 6.3*  CL 102  CO2 20*  GLUCOSE 123*  BUN 31*  CREATININE 4.05*  CALCIUM 6.0*   Liver Function Tests:  Recent Labs Lab 07/29/2015 1452  AST 488*  ALT 153*  ALKPHOS 35*  BILITOT 2.6*  PROT 4.7*  ALBUMIN 2.3*     Recent Labs Lab 07-29-2015 1339 07/29/15 1500  WBC 26.6*  --   HGB 17.6*  --   HCT 54.6*  --   MCV 97.7  --   PLT 18* 18*   Lab Results  Component Value Date   INR 2.88* 07/29/15   INR 2.89* July 29, 2015   Venous lactic acid 18.5 ->4.5 AST 488 ALT 153   Recent Labs Lab 29-Jul-2015 1225  GLUCAP 48*   Results for WALTON, DIGILIO (MRN [id number]) as of 07-29-15 17:32  Ref. Range 2015/07/29 15:00  Platelets Latest Ref Range: 150-400 K/uL 18 (LL)  Smear Review Unknown NO SCHISTOCYTES SEEN  D-Dimer, Quant Latest Ref Range: 0.00-0.48 ug/mL-FEU >20.00 (H)  Fibrinogen Latest Ref Range: 204-475 mg/dL 119  Prothrombin Time Latest Ref Range: 11.6-15.2 seconds 29.7 (H)  INR Latest Ref Range: 0.00-1.49  2.88 (H)  APTT Latest Ref Range: 24-37 seconds 51 (H)   Results for GUSTABO, GORDILLO (MRN [id number]) as of 07/29/15 17:32  Ref. Range 07/29/15 14:51  Procalcitonin Latest Units: ng/mL >175.00   Xrays/Other Studies: US Renal Port  2015/07/29   CLINICAL DATA:  Acute renal failure.  EXAM: RENAL / URINARY TRACT ULTRASOUND COMPLETE  COMPARISON:  None.  FINDINGS: Right Kidney:  Length: 11 cm. Cortical echogenicity and thickness appear within normal limits without mass or cyst. No renal stone or hydronephrosis.  Left Kidney:  Length: 11.7 cm. Cortical echogenicity and thickness appear within normal limits without mass or cyst. No renal stone or hydronephrosis.  Bladder:  Decompressed by Foley catheter.  IMPRESSION: Kidneys appear normal.  No hydronephrosis.  Bladder decompressed by Foley catheter.   Electronically Signed   By: Bary Richard M.D.   On: 07/29/15 16:09   Dg Chest Port 1 View  07-29-15   CLINICAL DATA:  Central line placement.  EXAM: PORTABLE CHEST - 1 VIEW  COMPARISON:  29-Jul-2015  FINDINGS: Endotracheal tube is in place with tip 7.4 cm above carina. Nasogastric tube is in place, tip beyond the imaged and beyond the gastroesophageal junction. Left IJ central line has been  placed, tip overlying the level of superior vena cava.  The heart is enlarged. There is bibasilar opacity, obscuring the medial aspect of the hemidiaphragms bilaterally. There is no evidence for pneumothorax.  IMPRESSION: 1. Interval placement of left IJ central line. 2. Interval placement of nasogastric tube, tip off the film. 3. No evidence for postprocedure pneumothorax.   Electronically Signed   By: Norva Pavlov M.D.   On: 07-29-15 17:03   Dg Chest Port 1 View  2015/07/29   CLINICAL DATA:  59 year old male with pneumonia. Endotracheal tube placement.  EXAM: PORTABLE CHEST - 1 VIEW  COMPARISON:  None  FINDINGS: An endotracheal tube is present with tip 7.2 cm above the carina.  Upper limits normal heart size noted.  Bibasilar atelectasis/airspace disease and possible small pleural effusions noted.  There is no evidence  of pneumothorax or pulmonary edema  No acute bony abnormalities are identified. Remote right-sided rib fractures are present.  IMPRESSION: Upper limits normal heart size with bibasilar atelectasis/airspace disease possible small pleural effusions.  Endotracheal tube with tip 7.2 cm above carina. Consider 1-2 cm advancement.   Electronically Signed   By: Harmon Pier M.D.   On: 2015/07/08 14:07   Background:  59 yo WM with minimal PMH (smoker, GERD) with  illness of rather short duration with septic shock in the setting of bilateral PNA, resp failure, and now oliguric renal failure, hyperkalemia, lactic acidosis, thrombocytopenia, coagulopathy,  who requires acute renal replacement therapy. Baseline creatinine unknown (1.5 on arrival to [facility] 9/18).   Impression/Plan 1. AKI - septic/hemodynamic AKI with probable ATN. Hyperkalemic and acidotic. Will require CRRT. CCM to place catheter after plt/FFP support. Keep vol even, all 4K fluids, no anticoagulation.   2. Metabolic acidosis - Peripheral bicarb drip can be adjusted by CCM. 3. Hypocalcemia - IV Ca (CCM has ordered) 4. Septic shock -  presumed d/t bilateral PNA. Pressors/steroids/ATB's per CCM 5. VDRF - per CCM 6. Abnl LFT's - presume shock liver 7. Sepsis induced severe thrombocytopenia - plts for lines. FFP. Trending CBC. 8. Coagulopathy - concern for DIC 9. Hypoglycemia - D50/glucose containing fluids. Cortisol pending.  Camille Bal,  MD [facility] [phone] pager 07/08/2015, 5:26 PM

## 2015-07-04 NOTE — Progress Notes (Signed)
The rest of the family arrived, patient has been deteriorating all day.  With wife, daughters and rest the family in the waiting room.  Unfortunately rooms were either locked or in use so conversation took place in the corner of the waiting room.  The patient is visibly deteriorating and I sat with family informed them of the extent of the organ failure and the noticeable deterioration.  We began discussing the code status but of the daughters started screaming in grief (which is perfectly understandable) and the structure of the meeting began to breakdown with grief (which again is perfectly understandable) so I made the choice to give them some time to discuss this amongst themselves and will revisit the topic at a later time.  For now ill continue full code status.  The patient is critically ill with multiple organ systems failure and requires high complexity decision making for assessment and support, frequent evaluation and titration of therapies, application of advanced monitoring technologies and extensive interpretation of multiple databases.   Critical Care Time devoted to patient care services described in this note is  45  Minutes. This time reflects time of care of this signee Dr Wesam Yacoub. This criticKoren Boundime does not reflect procedure time, or teaching time or supervisory time of PA/NP/Med student/Med Resident etc but could involve care discussion time.  Alyson Reedy, M.D. [facility] Pulmonary/Critical Care Medicine. Pager: [phone]. After hours pager: [phone].

## 2015-07-04 NOTE — Progress Notes (Addendum)
ANTIBIOTIC CONSULT NOTE - INITIAL  Pharmacy Consult for vancomycin, levofloxacin, and aztreonam Indication: sepsis  Allergies  Allergen Reactions  . Penicillins     Childhood allergy    Patient Measurements: Height: 6' (182.9 cm) Weight: 227 lb 15.3 oz (103.4 kg) IBW/kg (Calculated) : 77.6   Vital Signs: Temp: 103.7 F (39.8 C) (09/19 1217) Temp Source: Rectal (09/19 1217) BP: 149/125 mmHg (09/19 1530) Pulse Rate: 123 (09/19 1608) Intake/Output from previous day:   Intake/Output from this shift: Total I/O In: 897.5 [I.V.:247.5; IV Piggyback:650] Out: -   Labs:  Recent Labs  07/09/2015 1339 07/06/2015 1452 07/02/2015 1500  WBC 26.6*  --   --   HGB 17.6*  --   --   PLT 18*  --  PENDING  CREATININE  --  4.05*  --    Estimated Creatinine Clearance: 23.8 mL/min (by C-G formula based on Cr of 4.05). No results for input(s): VANCOTROUGH, VANCOPEAK, VANCORANDOM, GENTTROUGH, GENTPEAK, GENTRANDOM, TOBRATROUGH, TOBRAPEAK, TOBRARND, AMIKACINPEAK, AMIKACINTROU, AMIKACIN in the last 72 hours.   Microbiology: Recent Results (from the past 720 hour(s))  MRSA PCR Screening     Status: None   Collection Time: 06/25/2015  2:26 PM  Result Value Ref Range Status   MRSA by PCR NEGATIVE NEGATIVE Final    Comment:        The GeneXpert MRSA Assay (FDA approved for NASAL specimens only), is one component of a comprehensive MRSA colonization surveillance program. It is not intended to diagnose MRSA infection nor to guide or monitor treatment for MRSA infections.    Assessment: 13 YOM originally seen at [facility] on 9/18 with <1day history of AMS. Since then, has developed respiratory failure, acidosis and renal failure. Transferred to [facility] today after being intubated. SCr at [facility] was 1.5, now up to 4. eCrCl ~20-83mL/min.  Received vancomycin  IV x1, levofloxacin  IV x1 and aztreonam 2g IV, all were given ~1400 this afternoon.  Goal of Therapy:   Vancomycin trough level 15-20 mcg/ml  Plan:  -vancomycin  IV q48h starting 9/21 at 1400 -levofloxacin  IV q48h starting 9/21 at 1400 -aztreonam  IV q8h starting tonight at 2200 -follow renal function/renal plans, clinical progression, c/s  Lauren D. Bajbus, PharmD, BCPS Clinical Pharmacist Pager: [phone] 07/10/2015 4:26 PM  ADDENDUM Dr. Eliott Nine has seen and CRRT is to start. Antibiotic dosing will need to be adjusted. Orders are entered, anticipate CRRT to start ~1900  Plan: -vancomycin  ( /kg) IV q24h starting 9/20 at 2200- a little longer than 24h on CRRT since he received a /kg loading dose -levofloxacin  IV q24h starting 9/20 at 2000 -aztreonam 1g IV q8h for CVVHD/CVVHDF dosing starting tonight at 2200  Lauren D. Bajbus, PharmD, BCPS Clinical Pharmacist Pager: [phone] 07/03/2015 5:51 PM

## 2015-07-04 NOTE — H&P (Signed)
PULMONARY / CRITICAL CARE MEDICINE   Name: Johnny Harris MRN: [id number] DOB: Oct 23, 1955    ADMISSION DATE:  07/08/2015   REFERRING MD :  [facility]   CHIEF COMPLAINT:  Septic shock   INITIAL PRESENTATION:  59 year old male initially presented to [facility] on 9/18 w/ <1d h/o AMS. To be admitted w/ working dx of septic shock in setting of presumed PNA. Developed worsening LOC, respiratory failure, acidosis and renal failure. Transferred to [facility] after being intubated. On arrival working dx of: CAP-->resp failure; septic shock-->MODS-->renal failure, metabolic acidosis and profound thrombocytopenia.   STUDIES:  CT chest 9/19: bilateral infiltrates R>L, 2.7 cm  opacity right lung base.  CT abd/pelvis 9/19: negative for obstruction. No other acute findings. Except delayed renal nephrograms raising concern about either poor renal fxn vs pyelo   SIGNIFICANT EVENTS:    HISTORY OF PRESENT ILLNESS:   This is a 59 year old white male who works as a Consulting civil engineer for a [employer]. Smokes 2 ppd. Was in usual state of health until the am of 9/19. Per family he went to bed the night prior at about 8pm. Had cough but no different than baseline. Found by family w/ altered mental status and brought to the ER. On arrival found to be confused and hypotensive. Diagnostics included: CT chest, abd and head (results as above), blood cultures, IV fluid resuscitation and empiric rocephin and azithromycin. He was to be admitted to the ICU. Over the course of the evening initially his mental status improved some and the ER MD felt that the pt may have been feeling poorly for about a week leading up to the event. While in ER he was persistently hypotensive, became progressively mottled and diffusely ecchymotic. He was intubated for worsening mental status and concern for airway protection, got progressively hypoxic w/ P/F ratio of 189. [facility]   PAST MEDICAL HISTORY :    has a past medical history of Muscle pain; Calf pain; Cough; GERD (gastroesophageal reflux disease); and Bruises easily.  has past surgical history that includes Hernia repair. Prior to Admission medications   Medication Sig Start Date End Date Taking? Authorizing Provider  imiquimod (ALDARA) 5 % cream Apply topically 3 (three) times a week. 04/20/14   Delories Heinz, DPM   No Known Allergies  FAMILY HISTORY:  has no family status information on file.  SOCIAL HISTORY:  reports that he has never smoked. He has never used smokeless tobacco. He reports that he does not drink alcohol or use illicit drugs.  REVIEW OF SYSTEMS:  Unable   SUBJECTIVE:  Critically ill  VITAL SIGNS: Temp:  [103.7 F (39.8 C)] 103.7 F (39.8 C) (09/19 1217) Pulse Rate:  [122] 122 (09/19 1217) Resp:  [20] 20 (09/19 1217) BP: (94)/(82) 94/82 mmHg (09/19 1217) SpO2:  [99 %] 99 % (09/19 1217) FiO2 (%):  [70 %-100 %] 70 % (09/19 1310) Weight:  [103.4 kg (227 lb 15.3 oz)] 103.4 kg (227 lb 15.3 oz) (09/19 1311) HEMODYNAMICS:   VENTILATOR SETTINGS: Vent Mode:  [-] PRVC FiO2 (%):  [70 %-100 %] 70 % Set Rate:  [20 bmp-24 bmp] 24 bmp Vt Set:  [086 mL] 620 mL PEEP:  [5 cmH20] 5 cmH20 Plateau Pressure:  [16 cmH20] 16 cmH20 INTAKE / OUTPUT: No intake or output data in the 24 hours ending 06/24/2015 1326  PHYSICAL EXAMINATION: General:  Terminally ill appearing white male who is diffusely mottled and ecchymotic over  entire body  Neuro:  GCS 3 w/ no cough or gag  HEENT:  Pupils are fixed and pinpoint, mucous membranes are dry, poor dentition, no JVD  Cardiovascular:  rrr Lungs:  Scattered rhonchi, crackles in both bases  Abdomen:  Soft, hypoactive  Musculoskeletal:  Diffusely weak, + tone.  Skin:  Diffuse mottling and ecchymosis   LABS:  CBC No results for input(s): WBC, HGB, HCT, PLT in the last 168 hours. Coag's No results for input(s): APTT, INR in the last 168 hours. BMET No results for input(s):  NA, K, CL, CO2, BUN, CREATININE, GLUCOSE in the last 168 hours. Electrolytes No results for input(s): CALCIUM, MG, PHOS in the last 168 hours. Sepsis Markers No results for input(s): LATICACIDVEN, PROCALCITON, O2SATVEN in the last 168 hours. ABG  Recent Labs Lab 07/12/2015 1259  PHART 7.227*  PCO2ART 48.0*  PO2ART 189.0*   Liver Enzymes No results for input(s): AST, ALT, ALKPHOS, BILITOT, ALBUMIN in the last 168 hours. Cardiac Enzymes No results for input(s): TROPONINI, PROBNP in the last 168 hours. Glucose  Recent Labs Lab 07/11/2015 1225  GLUCAP 48*    Imaging No results found. R>L airspace disease.  Bedside US performed on 9/19 was negative for effusions.   ASSESSMENT / PLAN:  PULMONARY OETT /19>>> A: Acute hypoxic respiratory failure (P/F ration 189) in setting of bilateral CAP Right basilar pulmonary nodule P:   Full vent support PAD protocol Scheduled BDs See ID section  F/u ct imaging of chest 3-6 months if survives  CARDIOVASCULAR CVL left femoral CVL 9/19>>> A:  Septic Shock/MODS PAF w/ RVR P:  Cont LR for MIVFs Levophed and vasopressin for MAP >65 Will need IJ for CVP monitoring to a/w guiding fluids  Send stat cortisol Add stress dose steroids  No anticoagulation for now given thrombocytopenia   RENAL A:   AKI (scr on admit 1.5) Lactic acidosis   P:   Repeat chemistry and serial lactates.  Renal dose meds Strict I&O  GASTROINTESTINAL A:   Obesity  Likely critical illness related severe malnutrition  P:   PPI for SUP Nutritional consult w/ early tubefeeds   HEMATOLOGIC A:   Sepsis induced severe thrombocytopenia  Leukocytosis  Mild coagulopathy  P:  Trend CBC  PAS Transfuse platelets for planned central line  Send DIC panel Transfuse per usual ICU protocol   INFECTIOUS A:   CAP (NOS) Septic shock PCN allergy  P:   BCx2 9/18 ([place])>>> UC 9/18 ([facility])>> Sputum 9/19>>> BC 9/19>>> U strep 9/19>> u legionella  9/19>>> 9/18 azithro & Ceftriaxone X 1 9/19 vanc >>> 9/19 levaquin 9/19>>> 9/19 azactam 9/19>>> 9/19 RVP>>> 9/19 rapid flu >>>  ENDOCRINE A:   Hypoglycemia  R/o adrenal dysfxn P:   Stat random cortisol  cbg q 1 hr, then q 4 D50 given  Add Dextrose to MIVF  NEUROLOGIC A:   Acute encephalopathy in setting of sepsis Consider meningitis on d-dx but no report of nuchal rigidity or changes prior   >unsure of how much the versed gtt contributing.  CT head at [facility] w/out acute findings  P:   RASS goal: -2 D/c versed gtt Change to PRN fentanyl   FAMILY  - Updates: I have updated his wife and two daughters. At this point he is full code, but if things worsen (or if he were to arrest in spite of current measures) we would need to revisit this   - Inter-disciplinary family meet or Palliative Care meeting due by: 9/26  TODAY'S SUMMARY: 59 year old male initially presented to [facility] ER on 9/18 w/ <1d h/o AMS. To be admitted w/ working dx of septic shock in setting of presumed PNA. Developed worsening LOC, respiratory failure, acidosis and renal failure. Transferred to [facility] after being intubated. On arrival working dx of: CAP-->resp failure; septic shock-->MODS-->renal failure, metabolic acidosis and profound thrombocytopenia. I am concerned that his is rapidly failing w/ progressive MODS. Symptoms worrisome for pneumococcal.   Simonne Martinet ACNP-BC [facility] Pulmonary/Critical Care Pager # [phone] OR # [phone] if no answer   06/25/2015, 1:26 PM  Attending Note:  59 year old male with no significant PMH who presents to the hospital with altered mental status.  Progressed overtime to ARDS with acute respiratory failure, acute renal failure and septic shock.  Patient was transferred to [facility] for further management and need for renal to see patient.  Patient is profoundly hypoxemic and in septic shock.  He is mottled diffusely on exam.  Concern for DIC as well. Patient carries a  very high mortality.  Family wishes for full code status for now.  The patient is critically ill with multiple organ systems failure and requires high complexity decision making for assessment and support, frequent evaluation and titration of therapies, application of advanced monitoring technologies and extensive interpretation of multiple databases.   Critical Care Time devoted to patient care services described in this note is  60  Minutes. This time reflects time of care of this signee Dr Koren Bound. This critical care time does not reflect procedure time, or teaching time or supervisory time of PA/NP/Med student/Med Resident etc but could involve care discussion time.  Alyson Reedy, M.D. [facility] Pulmonary/Critical Care Medicine. Pager: [phone]. After hours pager: [phone].

## 2015-07-04 NOTE — Progress Notes (Signed)
Patient received from [facility] and placed on ventilator without any complications.  Will continue to monitor.

## 2015-07-05 ENCOUNTER — Inpatient Hospital Stay (HOSPITAL_COMMUNITY): Payer: BLUE CROSS/BLUE SHIELD

## 2015-07-05 ENCOUNTER — Encounter (HOSPITAL_COMMUNITY): Payer: Self-pay

## 2015-07-05 DIAGNOSIS — R6521 Severe sepsis with septic shock: Secondary | ICD-10-CM

## 2015-07-05 DIAGNOSIS — R0600 Dyspnea, unspecified: Secondary | ICD-10-CM

## 2015-07-05 DIAGNOSIS — N179 Acute kidney failure, unspecified: Secondary | ICD-10-CM

## 2015-07-05 DIAGNOSIS — J9600 Acute respiratory failure, unspecified whether with hypoxia or hypercapnia: Secondary | ICD-10-CM

## 2015-07-05 DIAGNOSIS — Z452 Encounter for adjustment and management of vascular access device: Secondary | ICD-10-CM | POA: Insufficient documentation

## 2015-07-05 DIAGNOSIS — N170 Acute kidney failure with tubular necrosis: Secondary | ICD-10-CM

## 2015-07-05 DIAGNOSIS — D689 Coagulation defect, unspecified: Secondary | ICD-10-CM

## 2015-07-05 DIAGNOSIS — A419 Sepsis, unspecified organism: Principal | ICD-10-CM

## 2015-07-05 DIAGNOSIS — J984 Other disorders of lung: Secondary | ICD-10-CM

## 2015-07-05 DIAGNOSIS — Z9911 Dependence on respirator [ventilator] status: Secondary | ICD-10-CM

## 2015-07-05 DIAGNOSIS — R680 Hypothermia, not associated with low environmental temperature: Secondary | ICD-10-CM

## 2015-07-05 LAB — PREPARE FRESH FROZEN PLASMA
UNIT DIVISION: 0
Unit division: 0
Unit division: 0
Unit division: 0

## 2015-07-05 LAB — GLUCOSE, CAPILLARY
GLUCOSE-CAPILLARY: 142 mg/dL — AB (ref 65–99)
GLUCOSE-CAPILLARY: 126 mg/dL — AB (ref 65–99)
Glucose-Capillary: 137 mg/dL — ABNORMAL HIGH (ref 65–99)
Glucose-Capillary: 170 mg/dL — ABNORMAL HIGH (ref 65–99)
Glucose-Capillary: 161 mg/dL — ABNORMAL HIGH (ref 65–99)
Glucose-Capillary: 68 mg/dL (ref 65–99)
Glucose-Capillary: 131 mg/dL — ABNORMAL HIGH (ref 65–99)

## 2015-07-05 LAB — POCT I-STAT 3, ART BLOOD GAS (G3+)
ACID-BASE DEFICIT: 9 mmol/L — AB (ref 0.0–2.0)
Acid-base deficit: 14 mmol/L — ABNORMAL HIGH (ref 0.0–2.0)
BICARBONATE: 12.6 meq/L — AB (ref 20.0–24.0)
Bicarbonate: 17.4 mEq/L — ABNORMAL LOW (ref 20.0–24.0)
O2 SAT: 98 %
O2 Saturation: 96 %
PCO2 ART: 42.5 mmHg (ref 35.0–45.0)
PCO2 ART: 32.6 mmHg — AB (ref 35.0–45.0)
PH ART: 7.231 — AB (ref 7.350–7.450)
Patient temperature: 98.6
TCO2: 19 mmol/L (ref 0–100)
TCO2: 14 mmol/L (ref 0–100)
pH, Arterial: 7.195 — CL (ref 7.350–7.450)
pO2, Arterial: 143 mmHg — ABNORMAL HIGH (ref 80.0–100.0)
pO2, Arterial: 96 mmHg (ref 80.0–100.0)

## 2015-07-05 LAB — RENAL FUNCTION PANEL
ALBUMIN: 2.1 g/dL — AB (ref 3.5–5.0)
ALBUMIN: 1.8 g/dL — AB (ref 3.5–5.0)
ANION GAP: 16 — AB (ref 5–15)
ANION GAP: 24 — AB (ref 5–15)
Albumin: 2.1 g/dL — ABNORMAL LOW (ref 3.5–5.0)
Anion gap: 22 — ABNORMAL HIGH (ref 5–15)
BUN: 35 mg/dL — ABNORMAL HIGH (ref 6–20)
BUN: 29 mg/dL — ABNORMAL HIGH (ref 6–20)
BUN: 25 mg/dL — AB (ref 6–20)
CALCIUM: 6 mg/dL — AB (ref 8.9–10.3)
CALCIUM: 5.5 mg/dL — AB (ref 8.9–10.3)
CHLORIDE: 100 mmol/L — AB (ref 101–111)
CO2: 17 mmol/L — AB (ref 22–32)
CO2: 11 mmol/L — ABNORMAL LOW (ref 22–32)
CO2: 11 mmol/L — ABNORMAL LOW (ref 22–32)
CREATININE: 2.79 mg/dL — AB (ref 0.61–1.24)
Calcium: 6.1 mg/dL — CL (ref 8.9–10.3)
Chloride: 99 mmol/L — ABNORMAL LOW (ref 101–111)
Chloride: 92 mmol/L — ABNORMAL LOW (ref 101–111)
Creatinine, Ser: 3.87 mg/dL — ABNORMAL HIGH (ref 0.61–1.24)
Creatinine, Ser: 3.02 mg/dL — ABNORMAL HIGH (ref 0.61–1.24)
GFR calc Af Amer: 27 mL/min — ABNORMAL LOW (ref >60)
GFR calc non Af Amer: 15 mL/min — ABNORMAL LOW (ref >60)
GFR, EST AFRICAN AMERICAN: 18 mL/min — AB (ref >60)
GFR, EST AFRICAN AMERICAN: 24 mL/min — AB (ref >60)
GFR, EST NON AFRICAN AMERICAN: 21 mL/min — AB (ref >60)
GFR, EST NON AFRICAN AMERICAN: 23 mL/min — AB (ref >60)
GLUCOSE: 169 mg/dL — AB (ref 65–99)
GLUCOSE: 140 mg/dL — AB (ref 65–99)
Glucose, Bld: 145 mg/dL — ABNORMAL HIGH (ref 65–99)
PHOSPHORUS: 10.2 mg/dL — AB (ref 2.5–4.6)
PHOSPHORUS: 9.4 mg/dL — AB (ref 2.5–4.6)
POTASSIUM: 6.2 mmol/L — AB (ref 3.5–5.1)
Phosphorus: 7.9 mg/dL — ABNORMAL HIGH (ref 2.5–4.6)
Potassium: 5 mmol/L (ref 3.5–5.1)
Potassium: 5.5 mmol/L — ABNORMAL HIGH (ref 3.5–5.1)
SODIUM: 134 mmol/L — AB (ref 135–145)
SODIUM: 125 mmol/L — AB (ref 135–145)
Sodium: 133 mmol/L — ABNORMAL LOW (ref 135–145)

## 2015-07-05 LAB — PREPARE PLATELET PHERESIS
UNIT DIVISION: 0
Unit division: 0

## 2015-07-05 LAB — MAGNESIUM: Magnesium: 1.8 mg/dL (ref 1.7–2.4)

## 2015-07-05 LAB — CBC
HCT: 30.2 % — ABNORMAL LOW (ref 39.0–52.0)
HEMOGLOBIN: 10 g/dL — AB (ref 13.0–17.0)
MCH: 30.6 pg (ref 26.0–34.0)
MCHC: 33.1 g/dL (ref 30.0–36.0)
MCV: 92.4 fL (ref 78.0–100.0)
PLATELETS: 5 10*3/uL — AB (ref 150–400)
RBC: 3.27 MIL/uL — AB (ref 4.22–5.81)
RDW: 17.2 % — ABNORMAL HIGH (ref 11.5–15.5)
WBC: 12.6 10*3/uL — AB (ref 4.0–10.5)

## 2015-07-05 LAB — C DIFFICILE QUICK SCREEN W PCR REFLEX
C DIFFICLE (CDIFF) ANTIGEN: NEGATIVE
C Diff interpretation: NEGATIVE
C Diff toxin: NEGATIVE

## 2015-07-05 MED ORDER — DEXMEDETOMIDINE HCL IN NACL 400 MCG/100ML IV SOLN: 0.4000 ug/kg/h | INTRAVENOUS | Status: DC

## 2015-07-05 MED ORDER — PRISMASOL BGK 0/2.5 32-2.5 MEQ/L IV SOLN
INTRAVENOUS | Status: DC
  Administered 2015-07-05: 19:00:00 via INTRAVENOUS_CENTRAL
  Filled 2015-07-05 (×2): qty 5000

## 2015-07-05 MED ORDER — DEXMEDETOMIDINE HCL IN NACL 200 MCG/50ML IV SOLN
0.4000 ug/kg/h | INTRAVENOUS | Status: DC
  Administered 2015-07-05: 0.4 ug/kg/h via INTRAVENOUS
  Administered 2015-07-05 (×3): 1.2 ug/kg/h via INTRAVENOUS
  Filled 2015-07-05 (×4): qty 50

## 2015-07-05 MED ORDER — PRISMASOL BGK 4/2.5 32-4-2.5 MEQ/L IV SOLN
INTRAVENOUS | Status: DC
  Administered 2015-07-05 – 2015-07-06 (×4): via INTRAVENOUS_CENTRAL
  Filled 2015-07-05 (×5): qty 5000

## 2015-07-05 MED ORDER — SODIUM CHLORIDE 0.9 % IV SOLN
25.0000 ug/h | INTRAVENOUS | Status: DC
  Administered 2015-07-05: 50 ug/h via INTRAVENOUS
  Administered 2015-07-06: 100 ug/h via INTRAVENOUS
  Filled 2015-07-05 (×2): qty 50

## 2015-07-05 MED ORDER — SODIUM CHLORIDE 0.9 % IV SOLN
1.0000 g | Freq: Once | INTRAVENOUS | Status: AC
  Administered 2015-07-05: 1 g via INTRAVENOUS
  Filled 2015-07-05: qty 10

## 2015-07-05 MED ORDER — VITAL HIGH PROTEIN PO LIQD
1000.0000 mL | ORAL | Status: DC
Start: 2015-07-05 — End: 2015-07-05
  Filled 2015-07-05 (×2): qty 1000

## 2015-07-05 MED ORDER — IMMUNE GLOBULIN (HUMAN) 20 GM/200ML IV SOLN
1.0000 g/kg | Freq: Once | INTRAVENOUS | Status: AC
  Administered 2015-07-05: 90 g via INTRAVENOUS
  Filled 2015-07-05: qty 100

## 2015-07-05 MED ORDER — CLINDAMYCIN PHOSPHATE 600 MG/50ML IV SOLN
600.0000 mg | Freq: Three times a day (TID) | INTRAVENOUS | Status: DC
  Administered 2015-07-05 – 2015-07-06 (×5): 600 mg via INTRAVENOUS
  Filled 2015-07-05 (×7): qty 50

## 2015-07-05 MED ORDER — SODIUM CHLORIDE 0.9 % IV SOLN
2.0000 g | Freq: Once | INTRAVENOUS | Status: AC
  Administered 2015-07-05: 2 g via INTRAVENOUS
  Filled 2015-07-05: qty 20

## 2015-07-05 MED ORDER — PANTOPRAZOLE SODIUM 40 MG IV SOLR
40.0000 mg | Freq: Every day | INTRAVENOUS | Status: DC
  Administered 2015-07-06: 40 mg via INTRAVENOUS
  Filled 2015-07-05 (×2): qty 40

## 2015-07-05 MED ORDER — VITAL HIGH PROTEIN PO LIQD
1000.0000 mL | ORAL | Status: DC
  Administered 2015-07-05: 1000 mL
  Filled 2015-07-05 (×3): qty 1000

## 2015-07-05 MED ORDER — SODIUM CHLORIDE 0.9 % IV SOLN
500.0000 mg | Freq: Two times a day (BID) | INTRAVENOUS | Status: DC
  Filled 2015-07-05 (×2): qty 0.5

## 2015-07-05 MED ORDER — FENTANYL BOLUS VIA INFUSION
50.0000 ug | INTRAVENOUS | Status: DC | PRN
  Filled 2015-07-05: qty 50

## 2015-07-05 MED ORDER — VITAL HIGH PROTEIN PO LIQD
1000.0000 mL | ORAL | Status: DC
  Filled 2015-07-05: qty 1000

## 2015-07-05 MED ORDER — PRISMASOL BGK 0/2.5 32-2.5 MEQ/L IV SOLN
INTRAVENOUS | Status: DC
  Administered 2015-07-05: 19:00:00 via INTRAVENOUS_CENTRAL
  Filled 2015-07-05 (×3): qty 5000

## 2015-07-05 MED ORDER — MEROPENEM 1 G IV SOLR
1.0000 g | Freq: Three times a day (TID) | INTRAVENOUS | Status: DC
  Administered 2015-07-05 – 2015-07-06 (×5): 1 g via INTRAVENOUS
  Filled 2015-07-05 (×7): qty 1

## 2015-07-05 MED ORDER — SODIUM CHLORIDE 0.9 % IV SOLN
500.0000 mg | Freq: Four times a day (QID) | INTRAVENOUS | Status: DC
  Filled 2015-07-05 (×3): qty 500

## 2015-07-05 MED ORDER — PRISMASOL BGK 4/2.5 32-4-2.5 MEQ/L IV SOLN
INTRAVENOUS | Status: DC
  Filled 2015-07-05 (×2): qty 5000

## 2015-07-05 MED ORDER — SODIUM CHLORIDE 0.9 % IV SOLN
Freq: Once | INTRAVENOUS | Status: AC
  Administered 2015-07-05: 14:00:00 via INTRAVENOUS

## 2015-07-05 MED ORDER — ANTISEPTIC ORAL RINSE SOLUTION (CORINZ)
7.0000 mL | OROMUCOSAL | Status: DC
  Administered 2015-07-05 – 2015-07-07 (×20): 7 mL via OROMUCOSAL

## 2015-07-05 MED ORDER — DEXTROSE 5 % IV SOLN
100.0000 mg | Freq: Two times a day (BID) | INTRAVENOUS | Status: DC
  Administered 2015-07-05 – 2015-07-06 (×4): 100 mg via INTRAVENOUS
  Filled 2015-07-05 (×5): qty 100

## 2015-07-05 MED ORDER — PRISMASOL BGK 4/2.5 32-4-2.5 MEQ/L IV SOLN
INTRAVENOUS | Status: DC
  Administered 2015-07-05 – 2015-07-06 (×3): via INTRAVENOUS_CENTRAL
  Filled 2015-07-05 (×6): qty 5000

## 2015-07-05 MED ORDER — PRO-STAT SUGAR FREE PO LIQD
30.0000 mL | Freq: Three times a day (TID) | ORAL | Status: DC
  Administered 2015-07-05 (×2): 30 mL
  Filled 2015-07-05 (×5): qty 30

## 2015-07-05 MED ORDER — PRISMASOL BGK 0/2.5 32-2.5 MEQ/L IV SOLN
INTRAVENOUS | Status: DC
  Administered 2015-07-05 – 2015-07-06 (×10): via INTRAVENOUS_CENTRAL
  Filled 2015-07-05 (×18): qty 5000

## 2015-07-05 MED ORDER — FENTANYL CITRATE (PF) 100 MCG/2ML IJ SOLN
50.0000 ug | Freq: Once | INTRAMUSCULAR | Status: DC
Start: 2015-07-05 — End: 2015-07-07

## 2015-07-05 MED ORDER — IMMUNE GLOBULIN (HUMAN) 5 GM/50ML IV SOLN
0.5000 g/kg | INTRAVENOUS | Status: DC
  Administered 2015-07-06: 45 g via INTRAVENOUS
  Filled 2015-07-05: qty 50

## 2015-07-05 NOTE — Progress Notes (Signed)
ANTIBIOTIC CONSULT NOTE - Follow-Up  Pharmacy Consult for vancomycin, levofloxacin, clindamycin and imipenem Indication: sepsis  Allergies  Allergen Reactions  . Penicillins     Childhood allergy    Patient Measurements: Height: 6' (182.9 cm) Weight: 233 lb 7.5 oz (105.9 kg) IBW/kg (Calculated) : 77.6   Vital Signs: Temp: 98.3 F (36.8 C) (09/20 0408) Temp Source: Axillary (09/20 0408) BP: 104/70 mmHg (09/20 0805) Pulse Rate: 88 (09/20 0805) Intake/Output from previous day: 09/19 0701 - 09/20 0700 In: 4758.5 [I.V.:2851.5; Blood:1157; IV Piggyback:750] Out: 3011 [Stool:1850] Intake/Output from this shift: Total I/O In: -  Out: 193 [Other:193]  Labs:  Recent Labs  07/12/2015 1339 06/25/2015 1452 06/26/2015 1500 07/05/15 0524  WBC 26.6*  --   --   --   HGB 17.6*  --   --   --   PLT 18*  --  18*  --   CREATININE  --  4.05*  --  3.87*   Estimated Creatinine Clearance: 25.2 mL/min (by C-G formula based on Cr of 3.87). No results for input(s): VANCOTROUGH, VANCOPEAK, VANCORANDOM, GENTTROUGH, GENTPEAK, GENTRANDOM, TOBRATROUGH, TOBRAPEAK, TOBRARND, AMIKACINPEAK, AMIKACINTROU, AMIKACIN in the last 72 hours.   Microbiology: Recent Results (from the past 720 hour(s))  MRSA PCR Screening     Status: None   Collection Time: 07/03/2015  2:26 PM  Result Value Ref Range Status   MRSA by PCR NEGATIVE NEGATIVE Final    Comment:        The GeneXpert MRSA Assay (FDA approved for NASAL specimens only), is one component of a comprehensive MRSA colonization surveillance program. It is not intended to diagnose MRSA infection nor to guide or monitor treatment for MRSA infections.   Culture, respiratory (NON-Expectorated)     Status: None (Preliminary result)   Collection Time: 07/06/2015  3:58 PM  Result Value Ref Range Status   Specimen Description TRACHEAL ASPIRATE  Final   Special Requests NONE  Final   Gram Stain   Final    NO WBC SEEN NO SQUAMOUS EPITHELIAL CELLS SEEN NO  ORGANISMS SEEN Performed at [facility]    Culture   Final    NO GROWTH 1 DAY Performed at [facility]    Report Status PENDING  Incomplete   Assessment: 68 YOM originally seen at [facility] on 9/18 with <1day history of AMS. Since then, has developed respiratory failure, acidosis and renal failure. Transferred to [facility] today after being intubated. SCr at [facility] was 1.5, now up to 4. eCrCl ~20-67mL/min.  Now on CRRT with possible TSS.   Goal of Therapy:  Vancomycin trough level 15-20 mcg/ml  Plan:  Vancomycin 1000 mg q24, Imipenem 500 mg q6h, Clindamycin 600 mg q8h, Levofloxacin 250 mg q24h D/C Azactam Pending CDif RVP Possible Bronch  Isaac Bliss, PharmD, BCPS Clinical Pharmacist Pager [phone] 07/05/2015 8:52 AM

## 2015-07-05 NOTE — Consult Note (Signed)
[facility]  Date of Admission:  06/20/2015  Date of Consult:  07/05/2015  Reason for Consult:Severe Sepsis Referring Physician: Titus Mould  Impression/Recommendation Sepsis VDRF ARF Coagulopathy  Would Change imipenem to merrem, decrease dose due to his renal fxn and risk of seizure.  Await BCx Await resp Cx Agree with use of clinda Will change levaquin to doxy Check RMSF, ehrlichia Check LP if his coags allow  Comment He is severely septic. His mottling could go with toxic shock (staph or strep) as noted by Dr Titus Mould, as well meningococcus could do this.  RMSF could do this but is not typically this rapidly progressive. His family notes that he is outside frequently.   Thank you so much for this interesting consult,   Bobby Rumpf (pager) [phone] [email or web address]  Johnny Harris is an 59 y.o. male.  HPI: 59 yo M with hx of GERD comes to [place] on 9-18 with acute mental status change. He was found to be in renal failure, respiratory failure, thorombocytopenic and septic shock.  He was found on CT chest to have R> L infiltrates, opacity in R lung base. No acute findings in abd except for delayed nephrograms (renal dysfunction vs pyelo).   He was started on ceftriaxone and azithro in ED however his mental status worsened developing mottling, ecchymosis and hypotension. He required in intubation. His temp was 103.7. He was transferred to [facility] ICU. His anbx were changed to vanco/levaquin/azactam. He has required vasopressin and levophed. He has been started on CRRT and bicarbonate drip.    Clindamycin added this am for concerns over toxic shock.  Now hypothermic  Past Medical History  Diagnosis Date  . Muscle pain   . Calf pain     with walking  . Cough   . GERD (gastroesophageal reflux disease)   . Bruises easily     Past Surgical History  Procedure Laterality Date  . Hernia repair       Allergies  Allergen Reactions    . Penicillins     Childhood allergy    Medications:  Scheduled: . sodium chloride   Intravenous Once  . albuterol  2.5 mg Nebulization Q4H  . antiseptic oral rinse  7 mL Mouth Rinse 10 times per day  . chlorhexidine gluconate  15 mL Mouth Rinse BID  . clindamycin (CLEOCIN) IV  600 mg Intravenous Q8H  . feeding supplement (VITAL HIGH PROTEIN)  1,000 mL Per Tube Q24H  . fentaNYL (SUBLIMAZE) injection  50 mcg Intravenous Once  . imipenem-cilastatin  500 mg Intravenous Q6H  . levofloxacin (LEVAQUIN) IV  250 mg Intravenous Q24H  . pantoprazole (PROTONIX) IV  40 mg Intravenous QHS  . vancomycin  1,000 mg Intravenous Q24H    Abtx:  Anti-infectives    Start     Dose/Rate Route Frequency Ordered Stop   07/06/15 1400  vancomycin (VANCOCIN) 1,500 mg in sodium chloride 0.9 % 500 mL IVPB  Status:  Discontinued     1,500 mg 250 mL/hr over 120 Minutes Intravenous Every 48 hours 07/08/2015 1627 06/30/2015 1753   07/06/15 1400  levofloxacin (LEVAQUIN) IVPB 500 mg  Status:  Discontinued     500 mg 100 mL/hr over 60 Minutes Intravenous Every 48 hours 06/18/2015 1627 07/09/2015 1753   07/05/15 2200  vancomycin (VANCOCIN) IVPB 1000 mg/200 mL premix     1,000 mg 200 mL/hr over 60 Minutes Intravenous Every 24 hours 06/24/2015 1753     07/05/15 2000  Levofloxacin (LEVAQUIN) IVPB 250  mg     250 mg 50 mL/hr over 60 Minutes Intravenous Every 24 hours 07/03/2015 1753     07/05/15 0900  imipenem-cilastatin (PRIMAXIN) 500 mg in sodium chloride 0.9 % 100 mL IVPB     500 mg 200 mL/hr over 30 Minutes Intravenous Every 6 hours 07/05/15 0844     07/05/15 0845  clindamycin (CLEOCIN) IVPB 600 mg     600 mg 100 mL/hr over 30 Minutes Intravenous Every 8 hours 07/05/15 0844     06/21/2015 2200  aztreonam (AZACTAM) 500 mg in dextrose 5 % 50 mL IVPB  Status:  Discontinued     500 mg 100 mL/hr over 30 Minutes Intravenous 3 times per day 06/25/2015 1627 07/10/2015 1753   07/08/2015 2200  aztreonam (AZACTAM) 1 g in dextrose 5 % 50 mL  IVPB  Status:  Discontinued     1 g 100 mL/hr over 30 Minutes Intravenous 3 times per day 07/06/2015 1753 07/05/15 0844   07/03/2015 1330  levofloxacin (LEVAQUIN) IVPB 750 mg  Status:  Discontinued     750 mg 100 mL/hr over 90 Minutes Intravenous  Once 07/09/2015 1308 06/16/2015 1427   06/25/2015 1330  vancomycin (VANCOCIN) IVPB 1000 mg/200 mL premix  Status:  Discontinued     1,000 mg 200 mL/hr over 60 Minutes Intravenous  Once 07/05/2015 1308 06/28/2015 1427   06/19/2015 1330  vancomycin (VANCOCIN) 2,500 mg in sodium chloride 0.9 % 500 mL IVPB     2,500 mg 250 mL/hr over 120 Minutes Intravenous  Once 06/18/2015 1320 06/30/2015 1558   06/22/2015 1330  aztreonam (AZACTAM) 2 g in dextrose 5 % 50 mL IVPB     2 g 100 mL/hr over 30 Minutes Intravenous  Once 06/26/2015 1320 06/25/2015 1421   06/21/2015 1330  levofloxacin (LEVAQUIN) IVPB 750 mg     750 mg 100 mL/hr over 90 Minutes Intravenous  Once 07/15/2015 1320 06/25/2015 1625      Total days of antibiotics: 1 1 vanco/levaquin 0 merrem/clinda          Social History:  reports that he has never smoked. He has never used smokeless tobacco. He reports that he does not drink alcohol or use illicit drugs.  No family history on file. Per his wife- no sick exposures, parents, children have good health.   General ROS: unobtainable. pt intbx. has followed commands overnight.   Blood pressure 104/70, pulse 88, temperature 93.6 F (34.2 C), temperature source Rectal, resp. rate 23, height 6' (1.829 m), weight 105.9 kg (233 lb 7.5 oz), SpO2 100 %. General appearance: toxic and awakens and responds to voice.  Eyes: negative findings: pupils equal, round, reactive to light and accomodation, no photophobia Throat: ET tube Neck: no adenopathy, supple, symmetrical, trachea midline and no resistance to movement of his neck. he states this doe shurt though.  Lungs: diminished breath sounds anterior - bilateral and rhonchi anterior - bilateral Heart: regular rate and  rhythm Abdomen: normal findings: soft, non-tender and abnormal findings:  hypoactive bowel sounds Extremities: feet cool Skin: diffuse mottling.    Results for orders placed or performed during the hospital encounter of 06/21/2015 (from the past 48 hour(s))  Glucose, capillary     Status: Abnormal   Collection Time: 07/03/2015 12:25 PM  Result Value Ref Range   Glucose-Capillary 48 (L) 65 - 99 mg/dL   Comment 1 Notify RN   I-STAT 3, arterial blood gas (G3+)     Status: Abnormal   Collection Time: 07/14/2015 12:59 PM  Result Value Ref Range   pH, Arterial 7.227 (L) 7.350 - 7.450   pCO2 arterial 48.0 (H) 35.0 - 45.0 mmHg   pO2, Arterial 189.0 (H) 80.0 - 100.0 mmHg   Bicarbonate 20.0 20.0 - 24.0 mEq/L   TCO2 21 0 - 100 mmol/L   O2 Saturation 99.0 %   Acid-base deficit 8.0 (H) 0.0 - 2.0 mmol/L   Patient temperature 98.6 F    Collection site RADIAL, ALLEN'S TEST ACCEPTABLE    Drawn by Operator    Sample type ARTERIAL   Prepare Pheresed Platelets     Status: None   Collection Time: 06/28/2015  1:37 PM  Result Value Ref Range   Unit Number [id number]    Blood Component Type PLTP LR2 PAS    Unit division 00    Status of Unit ISSUED,FINAL    Transfusion Status OK TO TRANSFUSE    Unit Number [id number]    Blood Component Type PLTPHER LR1    Unit division 00    Status of Unit ISSUED,FINAL    Transfusion Status OK TO TRANSFUSE   CBC     Status: Abnormal   Collection Time: 06/23/2015  1:39 PM  Result Value Ref Range   WBC 26.6 (H) 4.0 - 10.5 K/uL    Comment: WHITE COUNT CONFIRMED ON SMEAR REPEATED TO VERIFY    RBC 5.59 4.22 - 5.81 MIL/uL   Hemoglobin 17.6 (H) 13.0 - 17.0 g/dL   HCT 54.6 (H) 39.0 - 52.0 %   MCV 97.7 78.0 - 100.0 fL   MCH 31.5 26.0 - 34.0 pg   MCHC 32.2 30.0 - 36.0 g/dL   RDW 16.9 (H) 11.5 - 15.5 %   Platelets 18 (LL) 150 - 400 K/uL    Comment: PLATELET COUNT CONFIRMED BY SMEAR REPEATED TO VERIFY CRITICAL RESULT CALLED TO, READ BACK BY AND VERIFIED WITH: T  JOHNSON,RN AT 1434 07/15/2015 BY K BARR   Lactic acid, plasma     Status: Abnormal   Collection Time: 07/10/2015  1:39 PM  Result Value Ref Range   Lactic Acid, Venous 18.5 (HH) 0.5 - 2.0 mmol/L    Comment: RESULTS CONFIRMED BY MANUAL DILUTION CRITICAL RESULT CALLED TO, READ BACK BY AND VERIFIED WITH: JOHNSON,T RN @ 1530 06/24/2015 LEONARD,A   Protime-INR     Status: Abnormal   Collection Time: 07/11/2015  1:39 PM  Result Value Ref Range   Prothrombin Time 29.8 (H) 11.6 - 15.2 seconds   INR 2.89 (H) 0.00 - 1.49  APTT     Status: Abnormal   Collection Time: 06/19/2015  1:39 PM  Result Value Ref Range   aPTT 54 (H) 24 - 37 seconds    Comment:        IF BASELINE aPTT IS ELEVATED, SUGGEST PATIENT RISK ASSESSMENT BE USED TO DETERMINE APPROPRIATE ANTICOAGULANT THERAPY.   MRSA PCR Screening     Status: None   Collection Time: 06/24/2015  2:26 PM  Result Value Ref Range   MRSA by PCR NEGATIVE NEGATIVE    Comment:        The GeneXpert MRSA Assay (FDA approved for NASAL specimens only), is one component of a comprehensive MRSA colonization surveillance program. It is not intended to diagnose MRSA infection nor to guide or monitor treatment for MRSA infections.   Type and screen     Status: None (Preliminary result)   Collection Time: 06/20/2015  2:42 PM  Result Value Ref Range   ABO/RH(D) O POS    Antibody  Screen POS    Sample Expiration 07-13-2015    Antibody Identification ANTI K    PT AG Type NEGATIVE FOR KELL ANTIGEN    DAT, IgG NEG    Unit Number [id number]    Blood Component Type RED CELLS,LR    Unit division 00    Status of Unit ALLOCATED    Transfusion Status OK TO TRANSFUSE    Crossmatch Result COMPATIBLE    Donor AG Type NEGATIVE FOR KELL ANTIGEN    Unit Number [id number]    Blood Component Type RED CELLS,LR    Unit division 00    Status of Unit ALLOCATED    Transfusion Status OK TO TRANSFUSE    Crossmatch Result COMPATIBLE    Donor AG Type NEGATIVE FOR KELL  ANTIGEN   Procalcitonin     Status: None   Collection Time: 07/01/2015  2:51 PM  Result Value Ref Range   Procalcitonin >175.00 ng/mL    Comment: REPEATED TO VERIFY        Interpretation: PCT >= 10 ng/mL: Important systemic inflammatory response, almost exclusively due to severe bacterial sepsis or septic shock. (NOTE)         ICU PCT Algorithm               Non ICU PCT Algorithm    ----------------------------     ------------------------------         PCT < 0.25 ng/mL                 PCT < 0.1 ng/mL     Stopping of antibiotics            Stopping of antibiotics       strongly encouraged.               strongly encouraged.    ----------------------------     ------------------------------       PCT level decrease by               PCT < 0.25 ng/mL       >= 80% from peak PCT       OR PCT 0.25 - 0.5 ng/mL          Stopping of antibiotics                                             encouraged.     Stopping of antibiotics           encouraged.    ----------------------------     ------------------------------       PCT level decrease by              PCT >= 0.25 ng/mL       < 80% from peak PCT        AND PCT >=  0.5 ng/mL            Continuing antibiotics                                              encouraged.       Continuing antibiotics            encouraged.    ----------------------------     ------------------------------     PCT level increase  compared          PCT > 0.5 ng/mL         with peak PCT AND          PCT >= 0.5 ng/mL             Escalation of antibiotics                                          strongly encouraged.      Escalation of antibiotics        strongly encouraged.   Comprehensive metabolic panel     Status: Abnormal   Collection Time: 07/14/2015  2:52 PM  Result Value Ref Range   Sodium 136 135 - 145 mmol/L   Potassium 6.3 (HH) 3.5 - 5.1 mmol/L    Comment: CRITICAL RESULT CALLED TO, READ BACK BY AND VERIFIED WITH: T JOHNSON,RN 1545 07/12/2015 WBOND     Chloride 102 101 - 111 mmol/L   CO2 20 (L) 22 - 32 mmol/L   Glucose, Bld 123 (H) 65 - 99 mg/dL   BUN 31 (H) 6 - 20 mg/dL   Creatinine, Ser 4.05 (H) 0.61 - 1.24 mg/dL   Calcium 6.0 (LL) 8.9 - 10.3 mg/dL    Comment: CRITICAL RESULT CALLED TO, READ BACK BY AND VERIFIED WITH: T JOHNSON,RN 1545 06/24/2015 WBOND    Total Protein 4.7 (L) 6.5 - 8.1 g/dL   Albumin 2.3 (L) 3.5 - 5.0 g/dL   AST 488 (H) 15 - 41 U/L   ALT 153 (H) 17 - 63 U/L   Alkaline Phosphatase 35 (L) 38 - 126 U/L   Total Bilirubin 2.6 (H) 0.3 - 1.2 mg/dL   GFR calc non Af Amer 15 (L) >60 mL/min   GFR calc Af Amer 17 (L) >60 mL/min    Comment: (NOTE) The eGFR has been calculated using the CKD EPI equation. This calculation has not been validated in all clinical situations. eGFR's persistently <60 mL/min signify possible Chronic Kidney Disease.    Anion gap 14 5 - 15  Lactic acid, plasma     Status: Abnormal   Collection Time: 07/02/2015  3:00 PM  Result Value Ref Range   Lactic Acid, Venous 4.5 (HH) 0.5 - 2.0 mmol/L    Comment: CRITICAL RESULT CALLED TO, READ BACK BY AND VERIFIED WITH: K SHARP,RN 1659 06/24/2015 WBOND   DIC (disseminated intravasc coag) panel     Status: Abnormal   Collection Time: 06/25/2015  3:00 PM  Result Value Ref Range   Prothrombin Time 29.7 (H) 11.6 - 15.2 seconds   INR 2.88 (H) 0.00 - 1.49   aPTT 51 (H) 24 - 37 seconds    Comment:        IF BASELINE aPTT IS ELEVATED, SUGGEST PATIENT RISK ASSESSMENT BE USED TO DETERMINE APPROPRIATE ANTICOAGULANT THERAPY.    Fibrinogen 291 204 - 475 mg/dL   D-Dimer, Quant >20.00 (H) 0.00 - 0.48 ug/mL-FEU    Comment:        AT THE INHOUSE ESTABLISHED CUTOFF VALUE OF 0.48 ug/mL FEU, THIS ASSAY HAS BEEN DOCUMENTED IN THE LITERATURE TO HAVE A SENSITIVITY AND NEGATIVE PREDICTIVE VALUE OF AT LEAST 98 TO 99%.  THE TEST RESULT SHOULD BE CORRELATED WITH AN ASSESSMENT OF THE CLINICAL PROBABILITY OF DVT / VTE. REPEATED TO VERIFY    Platelets 18 (LL) 150 -  400 K/uL  Comment: REPEATED TO VERIFY PLATELET COUNT CONFIRMED BY SMEAR CRITICAL VALUE NOTED.  VALUE IS CONSISTENT WITH PREVIOUSLY REPORTED AND CALLED VALUE.    Smear Review NO SCHISTOCYTES SEEN   Cortisol     Status: None   Collection Time: 06/17/2015  3:30 PM  Result Value Ref Range   Cortisol, Plasma 68.4 ug/dL    Comment: RESULTS CONFIRMED BY MANUAL DILUTION (NOTE) AM    6.7 - 22.6 ug/dL PM   <10.0       ug/dL   Culture, respiratory (NON-Expectorated)     Status: None (Preliminary result)   Collection Time: 07/09/2015  3:58 PM  Result Value Ref Range   Specimen Description TRACHEAL ASPIRATE    Special Requests NONE    Gram Stain      NO WBC SEEN NO SQUAMOUS EPITHELIAL CELLS SEEN NO ORGANISMS SEEN Performed at [facility]    Culture      NO GROWTH 1 DAY Performed at [facility]    Report Status PENDING   Prepare fresh frozen plasma     Status: None   Collection Time: 06/21/2015  4:21 PM  Result Value Ref Range   Unit Number [id number]    Blood Component Type THAWED PLASMA    Unit division 00    Status of Unit ISSUED,FINAL    Transfusion Status OK TO TRANSFUSE    Unit Number [id number]    Blood Component Type THAWED PLASMA    Unit division 00    Status of Unit REL FROM [facility]    Transfusion Status OK TO TRANSFUSE    Unit Number [id number]    Blood Component Type THAWED PLASMA    Unit division 00    Status of Unit ISSUED,FINAL    Transfusion Status OK TO TRANSFUSE    Unit Number [id number]    Blood Component Type THAWED PLASMA    Unit division 00    Status of Unit ISSUED,FINAL    Transfusion Status OK TO TRANSFUSE   I-STAT 3, arterial blood gas (G3+)     Status: Abnormal   Collection Time: 06/19/2015  5:30 PM  Result Value Ref Range   pH, Arterial 7.247 (L) 7.350 - 7.450   pCO2 arterial 47.1 (H) 35.0 - 45.0 mmHg   pO2, Arterial 168.0 (H) 80.0 - 100.0 mmHg   Bicarbonate 19.9 (L) 20.0 - 24.0 mEq/L   TCO2 21 0 - 100 mmol/L   O2  Saturation 99.0 %   Acid-base deficit 6.0 (H) 0.0 - 2.0 mmol/L   Patient temperature 103.7 F    Collection site RADIAL, ALLEN'S TEST ACCEPTABLE    Drawn by Operator    Sample type ARTERIAL   Glucose, capillary     Status: Abnormal   Collection Time: 07/01/2015  5:31 PM  Result Value Ref Range   Glucose-Capillary 143 (H) 65 - 99 mg/dL  Glucose, capillary     Status: Abnormal   Collection Time: 07/09/2015  8:23 PM  Result Value Ref Range   Glucose-Capillary 142 (H) 65 - 99 mg/dL  Glucose, capillary     Status: Abnormal   Collection Time: 07/05/15 12:13 AM  Result Value Ref Range   Glucose-Capillary 137 (H) 65 - 99 mg/dL   Comment 1 Notify RN    Comment 2 Document in Chart   Glucose, capillary     Status: Abnormal   Collection Time: 07/05/15  4:02 AM  Result Value Ref Range   Glucose-Capillary 142 (H) 65 - 99 mg/dL   Comment  1 Notify RN    Comment 2 Document in Chart   Renal function panel (daily at 0500)     Status: Abnormal   Collection Time: 07/05/15  5:24 AM  Result Value Ref Range   Sodium 133 (L) 135 - 145 mmol/L   Potassium 5.0 3.5 - 5.1 mmol/L    Comment: DELTA CHECK NOTED   Chloride 100 (L) 101 - 111 mmol/L   CO2 17 (L) 22 - 32 mmol/L   Glucose, Bld 169 (H) 65 - 99 mg/dL   BUN 35 (H) 6 - 20 mg/dL   Creatinine, Ser 3.87 (H) 0.61 - 1.24 mg/dL   Calcium 6.1 (LL) 8.9 - 10.3 mg/dL    Comment: CRITICAL RESULT CALLED TO, READ BACK BY AND VERIFIED WITH: DUNLAP J,RN 07/05/15 0556 WAYK    Phosphorus 7.9 (H) 2.5 - 4.6 mg/dL   Albumin 2.1 (L) 3.5 - 5.0 g/dL   GFR calc non Af Amer 15 (L) >60 mL/min   GFR calc Af Amer 18 (L) >60 mL/min    Comment: (NOTE) The eGFR has been calculated using the CKD EPI equation. This calculation has not been validated in all clinical situations. eGFR's persistently <60 mL/min signify possible Chronic Kidney Disease.    Anion gap 16 (H) 5 - 15  Magnesium     Status: None   Collection Time: 07/05/15  5:24 AM  Result Value Ref Range   Magnesium  1.8 1.7 - 2.4 mg/dL  I-STAT 3, arterial blood gas (G3+)     Status: Abnormal   Collection Time: 07/05/15  8:07 AM  Result Value Ref Range   pH, Arterial 7.231 (L) 7.350 - 7.450   pCO2 arterial 42.5 35.0 - 45.0 mmHg   pO2, Arterial 143.0 (H) 80.0 - 100.0 mmHg   Bicarbonate 17.4 (L) 20.0 - 24.0 mEq/L   TCO2 19 0 - 100 mmol/L   O2 Saturation 98.0 %   Acid-base deficit 9.0 (H) 0.0 - 2.0 mmol/L   Patient temperature 102.2 F    Collection site ARTERIAL LINE    Drawn by Operator    Sample type ARTERIAL   Glucose, capillary     Status: Abnormal   Collection Time: 07/05/15  8:44 AM  Result Value Ref Range   Glucose-Capillary 170 (H) 65 - 99 mg/dL  CBC     Status: Abnormal (Preliminary result)   Collection Time: 07/05/15  8:55 AM  Result Value Ref Range   WBC 12.6 (H) 4.0 - 10.5 K/uL   RBC 3.27 (L) 4.22 - 5.81 MIL/uL   Hemoglobin 10.0 (L) 13.0 - 17.0 g/dL    Comment: REPEATED TO VERIFY   HCT 30.2 (L) 39.0 - 52.0 %   MCV 92.4 78.0 - 100.0 fL   MCH 30.6 26.0 - 34.0 pg   MCHC 33.1 30.0 - 36.0 g/dL   RDW 17.2 (H) 11.5 - 15.5 %   Platelets PENDING 150 - 400 K/uL      Component Value Date/Time   SDES TRACHEAL ASPIRATE 06/25/2015 1558   SPECREQUEST NONE 06/30/2015 1558   CULT  06/16/2015 1558    NO GROWTH 1 DAY Performed at [place] PENDING 06/30/2015 1558   US Renal Port  07/02/2015   CLINICAL DATA:  Acute renal failure.  EXAM: RENAL / URINARY TRACT ULTRASOUND COMPLETE  COMPARISON:  None.  FINDINGS: Right Kidney:  Length: 11 cm. Cortical echogenicity and thickness appear within normal limits without mass or cyst. No renal stone or hydronephrosis.  Left Kidney:  Length: 11.7 cm. Cortical echogenicity and thickness appear within normal limits without mass or cyst. No renal stone or hydronephrosis.  Bladder:  Decompressed by Foley catheter.  IMPRESSION: Kidneys appear normal.  No hydronephrosis.  Bladder decompressed by Foley catheter.   Electronically Signed   By:  Franki Cabot M.D.   On: 06/26/2015 16:09   Portable Chest Xray  07/05/2015   CLINICAL DATA:  Pneumonia.  Shortness of breath.  EXAM: PORTABLE CHEST - 1 VIEW  COMPARISON:  06/16/2015.  FINDINGS: Endotracheal tube, NG tube, bilateral IJ lines in stable position. Stable cardiomegaly. Slight progression of bibasilar atelectasis and/or infiltrates. Small pleural effusions cannot be completely excluded. No pneumothorax.  IMPRESSION: 1. Lines and tubes in stable position. 2. Slight progression of bibasilar atelectasis and/or infiltrates. Small pleural effusions cannot be excluded. 3. Stable cardiomegaly.   Electronically Signed   By: Marcello Moores  Register   On: 07/05/2015 07:23   Dg Chest Port 1 View  06/26/2015   CLINICAL DATA:  Central line placement portable exam 2238 hr compared to 06/17/2015  EXAM: PORTABLE CHEST - 1 VIEW  COMPARISON:  None.  FINDINGS: Tip of endotracheal tube projects 10.2 cm above carina.  Nasogastric tube extends into stomach.  New RIGHT jugular central venous catheter with tip projecting over proximal SVC.  LEFT jugular line tip projects over confluence of the SVC with LEFT brachiocephalic vein.  Enlargement of cardiac silhouette with vascular congestion.  Bibasilar atelectasis and questionable small pleural effusions.  No pneumothorax.  IMPRESSION: No pneumothorax following central line placement.  Line and tube positions as above.  Bibasilar atelectasis and questionable small pleural effusions.   Electronically Signed   By: Lavonia Dana M.D.   On: 06/16/2015 23:56   Dg Chest Port 1 View  07/13/2015   CLINICAL DATA:  Central line placement.  EXAM: PORTABLE CHEST - 1 VIEW  COMPARISON:  07/10/2015  FINDINGS: Endotracheal tube is in place with tip 7.4 cm above carina. Nasogastric tube is in place, tip beyond the imaged and beyond the gastroesophageal junction. Left IJ central line has been placed, tip overlying the level of superior vena cava.  The heart is enlarged. There is bibasilar opacity,  obscuring the medial aspect of the hemidiaphragms bilaterally. There is no evidence for pneumothorax.  IMPRESSION: 1. Interval placement of left IJ central line. 2. Interval placement of nasogastric tube, tip off the film. 3. No evidence for postprocedure pneumothorax.   Electronically Signed   By: Nolon Nations M.D.   On: 06/20/2015 17:03   Dg Chest Port 1 View  07/15/2015   CLINICAL DATA:  59 year old male with pneumonia. Endotracheal tube placement.  EXAM: PORTABLE CHEST - 1 VIEW  COMPARISON:  None  FINDINGS: An endotracheal tube is present with tip 7.2 cm above the carina.  Upper limits normal heart size noted.  Bibasilar atelectasis/airspace disease and possible small pleural effusions noted.  There is no evidence of pneumothorax or pulmonary edema  No acute bony abnormalities are identified. Remote right-sided rib fractures are present.  IMPRESSION: Upper limits normal heart size with bibasilar atelectasis/airspace disease possible small pleural effusions.  Endotracheal tube with tip 7.2 cm above carina. Consider 1-2 cm advancement.   Electronically Signed   By: Margarette Canada M.D.   On: 06/27/2015 14:07   Recent Results (from the past 240 hour(s))  MRSA PCR Screening     Status: None   Collection Time: 07/03/2015  2:26 PM  Result Value Ref Range Status   MRSA  by PCR NEGATIVE NEGATIVE Final    Comment:        The GeneXpert MRSA Assay (FDA approved for NASAL specimens only), is one component of a comprehensive MRSA colonization surveillance program. It is not intended to diagnose MRSA infection nor to guide or monitor treatment for MRSA infections.   Culture, respiratory (NON-Expectorated)     Status: None (Preliminary result)   Collection Time: 06/22/2015  3:58 PM  Result Value Ref Range Status   Specimen Description TRACHEAL ASPIRATE  Final   Special Requests NONE  Final   Gram Stain   Final    NO WBC SEEN NO SQUAMOUS EPITHELIAL CELLS SEEN NO ORGANISMS SEEN Performed at [facility]    Culture   Final    NO GROWTH 1 DAY Performed at [facility]    Report Status PENDING  Incomplete      07/05/2015, 9:52 AM     LOS: 1 day    Records and images were personally reviewed where available.

## 2015-07-05 NOTE — Progress Notes (Signed)
Initial Nutrition Assessment  DOCUMENTATION CODES:   Obesity unspecified  INTERVENTION:    Initiate TF via OGT with Vital High Protein at goal rate of 35 ml/h (840 ml per day) and Prostat 60 ml TID to provide 1440 kcals, 164 gm protein, 702 ml free water daily.  NUTRITION DIAGNOSIS:   Inadequate oral intake related to inability to eat as evidenced by NPO status.  GOAL:   Provide needs based on ASPEN/SCCM guidelines  MONITOR:   Vent status, Labs, Weight trends, TF tolerance  REASON FOR ASSESSMENT:   Consult Enteral/tube feeding initiation and management  ASSESSMENT:   59 year old male initially presented to [place] ER on 9/18 w/ <1d h/o AMS. To be admitted w/ working dx of septic shock in setting of presumed PNA. Developed worsening LOC, respiratory failure, acidosis and renal failure. Transferred to [facility] after being intubated. On arrival working dx of: CAP-->resp failure; septic shock-->MODS-->renal failure, metabolic acidosis and profound thrombocytopenia.   Labs reviewed: sodium low, phosphorus elevated.  Patient is receiving CRRT.  Unable to complete Nutrition-Focused physical exam at this time.  Patient is obese with BMI=31.7.  Patient is currently intubated on ventilator support Temp (24hrs), Avg:101.6 F (38.7 C), Min:93.1 F (33.9 C), Max:103.9 F (39.9 C)   Diet Order:  Diet NPO time specified  Skin:  Reviewed, no issues  Last BM:  9/20 (rectal tube)  Height:   Ht Readings from Last 1 Encounters:  06/21/2015 6' (1.829 m)    Weight:   Wt Readings from Last 1 Encounters:  07/05/15 233 lb 7.5 oz (105.9 kg)    Ideal Body Weight:  80.9 kg  BMI:  Body mass index is 31.66 kg/(m^2).  Estimated Nutritional Needs:   Kcal:  1610-9604  Protein:  >/= 162 gm  Fluid:  2 L  EDUCATION NEEDS:   No education needs identified at this time  Joaquin Courts, RD, LDN, CNSC Pager [phone] After Hours Pager [phone]

## 2015-07-05 NOTE — Progress Notes (Signed)
  Echocardiogram 2D Echocardiogram has been performed.  Leta Jungling M 07/05/2015, 4:00 PM

## 2015-07-05 NOTE — Progress Notes (Signed)
eLink Physician-Brief Progress Note Patient Name: Johnny Harris DOB: 1956/01/17 MRN: [id number]   Date of Service  07/05/2015  HPI/Events of Note  Agitatd. On low dose levophed .  On prn fent  eICU Interventions  Start precedex     Intervention Category Major Interventions: Other:  RAMASWAMY,MURALI 07/05/2015, 2:29 AM

## 2015-07-05 NOTE — Progress Notes (Signed)
[facility] Rounding Note  Subjective:  Pt intubated, sedated RN stated following commands earlier Remains mottled head to toe Watery diarrhea overnight - about a liter No urine output  Objective Vital signs in last 24 hours: Filed Vitals:   07/05/15 0515 07/05/15 0530 07/05/15 0600 07/05/15 0715  BP:      Pulse:      Temp:      TempSrc:      Resp: Height:      Weight:      SpO2:       Weight change:   Intake/Output Summary (Last 24 hours) at 07/05/15 0735 Last data filed at 07/05/15 0700  Gross per 24 hour  Intake 4758.53 ml  Output   3011 ml  Net 1747.53 ml   Physical Exam:  BP 102/67 mmHg  Pulse 106  Temp(Src) 98.3 F (36.8 C) (Axillary)  Resp 24  Ht 6' (1.829 m)  Wt 105.9 kg (233 lb 7.5 oz)  BMI 31.66 kg/m2  SpO2 100%  Gen: Intubated WM, mottled from head to toe ETT in place Chest: Regular S1S2 no s3 Lungs with bilateral coarse rhonchi Abdomen: soft, distended, quiet, mottled Ext: Mottled extremities, cold Neuro: Sedated Flexiseal watery diarrhea R IJ temp cath (9/19) in place no issues  Labs: Basic Metabolic Panel:  Recent Labs Lab 07/01/2015 1452 07/05/15 0524  NA 136 133*  K 6.3* 5.0  CL 102 100*  CO2 20* 17*  GLUCOSE 123* 169*  BUN 31* 35*  CREATININE 4.05* 3.87*  CALCIUM 6.0* 6.1*  PHOS  --  7.9*     Recent Labs Lab 06/16/2015 1452 07/05/15 0524  AST 488*  --   ALT 153*  --   ALKPHOS 35*  --   BILITOT 2.6*  --   PROT 4.7*  --   ALBUMIN 2.3* 2.1*     Recent Labs Lab 06/19/2015 1339 07/03/2015 1500  WBC 26.6*  --   HGB 17.6*  --   HCT 54.6*  --   MCV 97.7  --   PLT 18* 18*   CBG:  Recent Labs Lab 07/08/2015 1225 06/17/2015 1731 07/02/2015 2023 07/05/15 0013 07/05/15 0402  GLUCAP 48* 143* 142* 137* 142*   9/19 Venous lactic acid 18.5 ->4.5         AST 488         ALT 153   Results for Johnny Harris, Johnny Harris (MRN [id number]) as of 06/23/2015 17:32  Ref. Range 07/01/2015 15:00  Platelets Latest  Ref Range: 150-400 K/uL 18 (LL)  Smear Review Unknown NO SCHISTOCYTES SEEN  D-Dimer, Quant Latest Ref Range: 0.00-0.48 ug/mL-FEU >20.00 (H)  Fibrinogen Latest Ref Range: 204-475 mg/dL 409  Prothrombin Time Latest Ref Range: 11.6-15.2 seconds 29.7 (H)  INR Latest Ref Range: 0.00-1.49  2.88 (H)  APTT Latest Ref Range: 24-37 seconds 51 (H)   Results for Johnny Harris, Johnny Harris (MRN [id number]) as of 07/12/2015 17:32  Ref. Range 06/25/2015 14:51  Procalcitonin Latest Units: ng/mL >175.00       Results for Johnny Harris, Johnny Harris (MRN [id number]) as of 07/05/2015 07:43  Ref. Range 06/26/2015 15:30  Cortisol, Plasma Latest Units: ug/dL 57.8   Studies/Results: US Renal Port  07/01/2015   CLINICAL DATA:  Acute renal failure.  EXAM: RENAL / URINARY TRACT ULTRASOUND COMPLETE  COMPARISON:  None.  FINDINGS: Right Kidney:  Length: 11 cm. Cortical echogenicity and thickness appear within normal limits without mass or cyst. No renal stone or hydronephrosis.  Left Kidney:  Length:  11.7 cm. Cortical echogenicity and thickness appear within normal limits without mass or cyst. No renal stone or hydronephrosis.  Bladder:  Decompressed by Foley catheter.  IMPRESSION: Kidneys appear normal.  No hydronephrosis.  Bladder decompressed by Foley catheter.   Electronically Signed   By: Bary Richard M.D.   On: 07/12/2015 16:09   Portable Chest Xray  07/05/2015   CLINICAL DATA:  Pneumonia.  Shortness of breath.  EXAM: PORTABLE CHEST - 1 VIEW  COMPARISON:  07/06/2015.  FINDINGS: Endotracheal tube, NG tube, bilateral IJ lines in stable position. Stable cardiomegaly. Slight progression of bibasilar atelectasis and/or infiltrates. Small pleural effusions cannot be completely excluded. No pneumothorax.  IMPRESSION: 1. Lines and tubes in stable position. 2. Slight progression of bibasilar atelectasis and/or infiltrates. Small pleural effusions cannot be excluded. 3. Stable cardiomegaly.   Electronically Signed   By: Maisie Fus   Register   On: 07/05/2015 07:23   Dg Chest Port 1 View  06/30/2015   CLINICAL DATA:  Central line placement portable exam 2238 hr compared to 06/24/2015  EXAM: PORTABLE CHEST - 1 VIEW  COMPARISON:  None.  FINDINGS: Tip of endotracheal tube projects 10.2 cm above carina.  Nasogastric tube extends into stomach.  New RIGHT jugular central venous catheter with tip projecting over proximal SVC.  LEFT jugular line tip projects over confluence of the SVC with LEFT brachiocephalic vein.  Enlargement of cardiac silhouette with vascular congestion.  Bibasilar atelectasis and questionable small pleural effusions.  No pneumothorax.  IMPRESSION: No pneumothorax following central line placement.  Line and tube positions as above.  Bibasilar atelectasis and questionable small pleural effusions.   Electronically Signed   By: Ulyses Southward M.D.   On: 07/03/2015 23:56   Dg Chest Port 1 View  07/10/2015   CLINICAL DATA:  Central line placement.  EXAM: PORTABLE CHEST - 1 VIEW  COMPARISON:  07/06/2015  FINDINGS: Endotracheal tube is in place with tip 7.4 cm above carina. Nasogastric tube is in place, tip beyond the imaged and beyond the gastroesophageal junction. Left IJ central line has been placed, tip overlying the level of superior vena cava.  The heart is enlarged. There is bibasilar opacity, obscuring the medial aspect of the hemidiaphragms bilaterally. There is no evidence for pneumothorax.  IMPRESSION: 1. Interval placement of left IJ central line. 2. Interval placement of nasogastric tube, tip off the film. 3. No evidence for postprocedure pneumothorax.   Electronically Signed   By: Norva Pavlov M.D.   On: 07/08/2015 17:03   Dg Chest Port 1 View  06/25/2015   CLINICAL DATA:  59 year old male with pneumonia. Endotracheal tube placement.  EXAM: PORTABLE CHEST - 1 VIEW  COMPARISON:  None  FINDINGS: An endotracheal tube is present with tip 7.2 cm above the carina.  Upper limits normal heart size noted.  Bibasilar  atelectasis/airspace disease and possible small pleural effusions noted.  There is no evidence of pneumothorax or pulmonary edema  No acute bony abnormalities are identified. Remote right-sided rib fractures are present.  IMPRESSION: Upper limits normal heart size with bibasilar atelectasis/airspace disease possible small pleural effusions.  Endotracheal tube with tip 7.2 cm above carina. Consider 1-2 cm advancement.   Electronically Signed   By: Harmon Pier M.D.   On: 07/01/2015 14:07   Medications: . dexmedetomidine 1.2 mcg/kg/hr (07/05/15 0718)  . norepinephrine (LEVOPHED) Adult infusion 10 mcg/min (07/05/15 0400)  . dialysis replacement fluid (prismasate) 400 mL/hr at 06/29/2015 2317  . dialysis replacement fluid (prismasate) 200 mL/hr at 07/12/2015  2316  . dialysate (PRISMASATE) 1,500 mL/hr at 07/05/15 0605  .  sodium bicarbonate  infusion 1000 mL 125 mL/hr at 07/05/15 0137  . vasopressin (PITRESSIN) infusion - *FOR SHOCK* 0.03 Units/min (07-06-2015 1415)   . sodium chloride   Intravenous Once  . albuterol  2.5 mg Nebulization Q4H  . antiseptic oral rinse  7 mL Mouth Rinse 10 times per day  . aztreonam  1 g Intravenous 3 times per day  . calcium gluconate  1 g Intravenous Once  . chlorhexidine gluconate  15 mL Mouth Rinse BID  . hydrocortisone sod succinate (SOLU-CORTEF) inj  50 mg Intravenous Q6H  . levofloxacin (LEVAQUIN) IV  250 mg Intravenous Q24H  . pantoprazole (PROTONIX) IV  40 mg Intravenous QHS  . vancomycin  1,000 mg Intravenous Q24H   Infusion Medications . dexmedetomidine 1.2 mcg/kg/hr (07/05/15 0718)  . norepinephrine (LEVOPHED) Adult infusion 10 mcg/min (07/05/15 0400)  . dialysis replacement fluid (prismasate) 400 mL/hr at 07/06/2015 2317  . dialysis replacement fluid (prismasate) 200 mL/hr at 07-06-15 2316  . dialysate (PRISMASATE) 1,500 mL/hr at 07/05/15 0605  .  sodium bicarbonate  infusion 1000 mL 125 mL/hr at 07/05/15 0137  . vasopressin (PITRESSIN) infusion - *FOR  SHOCK* 0.03 Units/min (07-06-2015 1415)   Background 59 y.o. year-old WM with background of tobacco use, GERD who presented to [facility] with 1 week illness w/cough, developed AMS. Presented to [facility] ED with AMS. Intubated for resp failure with bilateral pulm infiltrates. Transferred 9/19 to [facility] for further management of septic shock, MODS, renal failure, metabolic acidosis, profound thrombocytopenia. We were asked to see d/t oligoanuric renal failure, hyperkalemia. CRRT initiated 07/06/2015  Impression/Plan 1. AKI - septic/hemodynamic AKI with probable ATN. Hyperkalemia improving. Remains acidotic. CRRT initiated 9/19. Continue current prescription - keeping volume even, no anticoagulation, all 4K fluids.      2. Metabolic acidosis - Peripheral bicarb drip can be adjusted by CCM. Currently running at 125/hour isotonic bicarb 3. Hypocalcemia - IV Ca prn (CCM has been ordering) 4. Septic shock - presumed d/t bilateral PNA. Pressors/steroids/ATB's per CCM 5. VDRF - per CCM 6. Abnl LFT's - presume shock liver 7. Sepsis induced severe thrombocytopenia - plts for lines. FFP. Trending CBC. No CBC today yet 8. Coagulopathy - concern for DIC 9. Hypoglycemia - D50/glucose containing fluids. Cortisol 68 10. Diarrhea - check CDiff   Camille Bal, MD [facility] [phone] pager 07/05/2015, 7:35 AM

## 2015-07-05 NOTE — Progress Notes (Signed)
eLink Physician-Brief Progress Note Patient Name: Johnny Harris DOB: November 16, 1955 MRN: [id number]   Date of Service  07/05/2015  HPI/Events of Note  Corrected caclium 7.6  eICU Interventions  1gm calciumg  gluconate     Intervention Category Minor Interventions: Electrolytes abnormality - evaluation and management  RAMASWAMY,MURALI 07/05/2015, 6:24 AM

## 2015-07-05 NOTE — Progress Notes (Signed)
ANTIBIOTIC CONSULT NOTE - Follow-Up  Pharmacy Consult for vancomycin, clindamycin, doxycycline, meropenem Indication: sepsis  Allergies  Allergen Reactions  . Penicillins     Childhood allergy    Patient Measurements: Height: 6' (182.9 cm) Weight: 233 lb 7.5 oz (105.9 kg) IBW/kg (Calculated) : 77.6   Vital Signs: Temp: 93.6 F (34.2 C) (09/20 0844) Temp Source: Rectal (09/20 0844) BP: 104/70 mmHg (09/20 0805) Pulse Rate: 88 (09/20 0805) Intake/Output from previous day: 09/19 0701 - 09/20 0700 In: 4758.5 [I.V.:2851.5; Blood:1157; IV Piggyback:750] Out: 3011 [Stool:1850] Intake/Output from this shift: Total I/O In: -  Out: 394 [Other:394]  Labs:  Recent Labs  07/10/2015 1339 06/22/2015 1452 06/27/2015 1500 07/05/15 0524 07/05/15 0855  WBC 26.6*  --   --   --  12.6*  HGB 17.6*  --   --   --  10.0*  PLT 18*  --  18*  --  5*  CREATININE  --  4.05*  --  3.87*  --    Estimated Creatinine Clearance: 25.2 mL/min (by C-G formula based on Cr of 3.87). No results for input(s): VANCOTROUGH, VANCOPEAK, VANCORANDOM, GENTTROUGH, GENTPEAK, GENTRANDOM, TOBRATROUGH, TOBRAPEAK, TOBRARND, AMIKACINPEAK, AMIKACINTROU, AMIKACIN in the last 72 hours.   Microbiology: Recent Results (from the past 720 hour(s))  MRSA PCR Screening     Status: None   Collection Time: 06/23/2015  2:26 PM  Result Value Ref Range Status   MRSA by PCR NEGATIVE NEGATIVE Final    Comment:        The GeneXpert MRSA Assay (FDA approved for NASAL specimens only), is one component of a comprehensive MRSA colonization surveillance program. It is not intended to diagnose MRSA infection nor to guide or monitor treatment for MRSA infections.   Culture, respiratory (NON-Expectorated)     Status: None (Preliminary result)   Collection Time: 07/06/2015  3:58 PM  Result Value Ref Range Status   Specimen Description TRACHEAL ASPIRATE  Final   Special Requests NONE  Final   Gram Stain   Final    NO WBC SEEN NO SQUAMOUS  EPITHELIAL CELLS SEEN NO ORGANISMS SEEN Performed at [facility]    Culture   Final    NO GROWTH 1 DAY Performed at [facility]    Report Status PENDING  Incomplete   Assessment: 13 YOM originally seen at [facility] on 9/18 with <1day history of AMS. Since then, has developed respiratory failure, acidosis and renal failure. Transferred to [facility] today after being intubated. SCr at [facility] was 1.5, now up to 4. eCrCl ~20-8mL/min.  Now on CRRT with possible TSS.   Goal of Therapy:  Vancomycin trough level 15-20 mcg/ml  Plan:  Continue Vancomycin 1000 mg q24 Continue Clindamycin 600 mg q8h, D/c Levofloxacin and change to doxycycline 100 mg IV q12h D/c imipenem and change to meropenem 1g IV q8h Pending CDif RVP Possible Bronch  Greggory Stallion, PharmD Clinical Pharmacy Resident Pager # [phone] 07/05/2015 10:45 AM

## 2015-07-05 NOTE — Progress Notes (Addendum)
PULMONARY / CRITICAL CARE MEDICINE   Name: Johnny Harris MRN: [id number] DOB: 02/27/1956    ADMISSION DATE:  07/12/15   REFERRING MD :  [facility]   CHIEF COMPLAINT:  Septic shock   INITIAL PRESENTATION:  59 year old male initially presented to [place] ER on 9/18 w/ <1d h/o AMS. To be admitted w/ working dx of septic shock in setting of presumed PNA. Developed worsening LOC, respiratory failure, acidosis and renal failure. Transferred to [facility] after being intubated. On arrival working dx of: CAP-->resp failure; septic shock-->MODS-->renal failure, metabolic acidosis and profound thrombocytopenia.   STUDIES:  CT chest 9/19: bilateral infiltrates R>L, 2.7 cm  opacity right lung base.  CT abd/pelvis 9/19: negative for obstruction. No other acute findings. Except delayed renal nephrograms raising concern about either poor renal fxn vs pyelo   SIGNIFICANT EVENTS: 9/19- cvvhd, acidosis  SUBJECTIVE:  precedex on with shock Some drop pressor needs cvvhd  VITAL SIGNS: Temp:  [98.3 F (36.8 C)-103.9 F (39.9 C)] 98.3 F (36.8 C) (09/20 0408) Pulse Rate:  [33-132] 88 (09/20 0805) Resp:  [20-28] 23 (09/20 0805) BP: (91-149)/(34-125) 104/70 mmHg (09/20 0805) SpO2:  [55 %-100 %] 100 % (09/20 0245) Arterial Line BP: (91-156)/(51-92) 115/79 mmHg (09/20 0715) FiO2 (%):  [70 %-100 %] 70 % (09/20 0805) Weight:  [103.4 kg (227 lb 15.3 oz)-105.9 kg (233 lb 7.5 oz)] 105.9 kg (233 lb 7.5 oz) (09/20 0500) HEMODYNAMICS: CVP:  [8 mmHg-12 mmHg] 11 mmHg VENTILATOR SETTINGS: Vent Mode:  [-] PRVC FiO2 (%):  [70 %-100 %] 70 % Set Rate:  [20 bmp-24 bmp] 24 bmp Vt Set:  [086 mL] 620 mL PEEP:  [5 cmH20] 5 cmH20 Plateau Pressure:  [16 cmH20-21 cmH20] 19 cmH20 INTAKE / OUTPUT:  Intake/Output Summary (Last 24 hours) at 07/05/15 0810 Last data filed at 07/05/15 0700  Gross per 24 hour  Intake 4758.53 ml  Output   3011 ml  Net 1747.53 ml    PHYSICAL EXAMINATION: General: sedated Neuro:  perrl left irregular HEENT: ett, jvd wnl PULM: coarse clear CV: s1 s2 IRR GI: soft, bs hypo, Nt, ND, no r/g Extremities: plius 1 edema, mottled severe to chest nipples and above, pulses dopplerable bilateral  LABS:  CBC  Recent Labs Lab 07/12/2015 1339 July 12, 2015 1500  WBC 26.6*  --   HGB 17.6*  --   HCT 54.6*  --   PLT 18* 18*   Coag's  Recent Labs Lab Jul 12, 2015 1339 Jul 12, 2015 1500  APTT 54* 51*  INR 2.89* 2.88*   BMET  Recent Labs Lab 2015-07-12 1452 07/05/15 0524  NA 136 133*  K 6.3* 5.0  CL 102 100*  CO2 20* 17*  BUN 31* 35*  CREATININE 4.05* 3.87*  GLUCOSE 123* 169*   Electrolytes  Recent Labs Lab 2015-07-12 1452 07/05/15 0524  CALCIUM 6.0* 6.1*  MG  --  1.8  PHOS  --  7.9*   Sepsis Markers  Recent Labs Lab 07-12-15 1339 2015/07/12 1451 07-12-15 1500  LATICACIDVEN 18.5*  --  4.5*  PROCALCITON  --  >175.00  --    ABG  Recent Labs Lab 07-12-2015 1259 07-12-15 1730  PHART 7.227* 7.247*  PCO2ART 48.0* 47.1*  PO2ART 189.0* 168.0*   Liver Enzymes  Recent Labs Lab 07/12/15 1452 07/05/15 0524  AST 488*  --   ALT 153*  --   ALKPHOS 35*  --   BILITOT 2.6*  --   ALBUMIN 2.3* 2.1*   Cardiac Enzymes No results for input(s): TROPONINI,  PROBNP in the last 168 hours. Glucose  Recent Labs Lab 2015-07-14 1225 07-14-2015 1731 2015/07/14 2023 07/05/15 0013 07/05/15 0402  GLUCAP 48* 143* 142* 137* 142*    Imaging US Renal Port  2015-07-14   CLINICAL DATA:  Acute renal failure.  EXAM: RENAL / URINARY TRACT ULTRASOUND COMPLETE  COMPARISON:  None.  FINDINGS: Right Kidney:  Length: 11 cm. Cortical echogenicity and thickness appear within normal limits without mass or cyst. No renal stone or hydronephrosis.  Left Kidney:  Length: 11.7 cm. Cortical echogenicity and thickness appear within normal limits without mass or cyst. No renal stone or hydronephrosis.  Bladder:  Decompressed by Foley catheter.  IMPRESSION: Kidneys appear normal.  No hydronephrosis.   Bladder decompressed by Foley catheter.   Electronically Signed   By: Bary Richard M.D.   On: Jul 14, 2015 16:09   Portable Chest Xray  07/05/2015   CLINICAL DATA:  Pneumonia.  Shortness of breath.  EXAM: PORTABLE CHEST - 1 VIEW  COMPARISON:  14-Jul-2015.  FINDINGS: Endotracheal tube, NG tube, bilateral IJ lines in stable position. Stable cardiomegaly. Slight progression of bibasilar atelectasis and/or infiltrates. Small pleural effusions cannot be completely excluded. No pneumothorax.  IMPRESSION: 1. Lines and tubes in stable position. 2. Slight progression of bibasilar atelectasis and/or infiltrates. Small pleural effusions cannot be excluded. 3. Stable cardiomegaly.   Electronically Signed   By: Maisie Fus  Register   On: 07/05/2015 07:23   Dg Chest Port 1 View  2015/07/14   CLINICAL DATA:  Central line placement portable exam 2238 hr compared to 07-14-15  EXAM: PORTABLE CHEST - 1 VIEW  COMPARISON:  None.  FINDINGS: Tip of endotracheal tube projects 10.2 cm above carina.  Nasogastric tube extends into stomach.  New RIGHT jugular central venous catheter with tip projecting over proximal SVC.  LEFT jugular line tip projects over confluence of the SVC with LEFT brachiocephalic vein.  Enlargement of cardiac silhouette with vascular congestion.  Bibasilar atelectasis and questionable small pleural effusions.  No pneumothorax.  IMPRESSION: No pneumothorax following central line placement.  Line and tube positions as above.  Bibasilar atelectasis and questionable small pleural effusions.   Electronically Signed   By: Ulyses Southward M.D.   On: 14-Jul-2015 23:56   Dg Chest Port 1 View  07/14/2015   CLINICAL DATA:  Central line placement.  EXAM: PORTABLE CHEST - 1 VIEW  COMPARISON:  2015-07-14  FINDINGS: Endotracheal tube is in place with tip 7.4 cm above carina. Nasogastric tube is in place, tip beyond the imaged and beyond the gastroesophageal junction. Left IJ central line has been placed, tip overlying the level of  superior vena cava.  The heart is enlarged. There is bibasilar opacity, obscuring the medial aspect of the hemidiaphragms bilaterally. There is no evidence for pneumothorax.  IMPRESSION: 1. Interval placement of left IJ central line. 2. Interval placement of nasogastric tube, tip off the film. 3. No evidence for postprocedure pneumothorax.   Electronically Signed   By: Norva Pavlov M.D.   On: Jul 14, 2015 17:03   Dg Chest Port 1 View  07/14/2015   CLINICAL DATA:  59 year old male with pneumonia. Endotracheal tube placement.  EXAM: PORTABLE CHEST - 1 VIEW  COMPARISON:  None  FINDINGS: An endotracheal tube is present with tip 7.2 cm above the carina.  Upper limits normal heart size noted.  Bibasilar atelectasis/airspace disease and possible small pleural effusions noted.  There is no evidence of pneumothorax or pulmonary edema  No acute bony abnormalities are identified. Remote right-sided rib  fractures are present.  IMPRESSION: Upper limits normal heart size with bibasilar atelectasis/airspace disease possible small pleural effusions.  Endotracheal tube with tip 7.2 cm above carina. Consider 1-2 cm advancement.   Electronically Signed   By: Harmon Pier M.D.   On: 07/23/2015 14:07   R>L airspace disease.  Bedside US performed on 9/19 was negative for effusions.   ASSESSMENT / PLAN:  PULMONARY OETT /19>>> A: Acute hypoxic respiratory failure (P/F ration 189) in setting of bilateral CAP Right basilar pulmonary nodule P:   pcxr bibasilar infiltrates but doesn't account for how sick he is pcxr repeat Re order disc  From outside hospital pcxr in am  ABg reviewed, keep plat less 30 Increase rate 30, ensure at 7 cc/kg Repeat abg  CARDIOVASCULAR CVL left femoral CVL 9/19>>> A:  Septic Shock/MODS PAF w/ RVR- controlled Cortisol 68 P:  Levophed and vasopressin for MAP >60 vasopressin Dc stress dose steroids  No anticoagulation for now given thrombocytopenia  cvp 10 noted, to lower  volume Echo assessment Dc fem line  RENAL A:   ARF Lactic acidosis clearing  P:   cvvhd Even goals bmets per renal Bicarb to 50 , K noted improved, likely to dc  GASTROINTESTINAL A:   Obesity  Likely critical illness related severe malnutrition  Shock liver likely P:   PPI for SUP Start feeds lft in am  Get hep panel  HEMATOLOGIC A:   Sepsis induced severe thrombocytopenia  Leukocytosis Mild coagulopathy  P:  Trend CBC in am  PAS Transfuse per usual ICU protocol  No bleeding, no ffp needed lft repeat  Fibrinogen, reassuring, repeat in am   INFECTIOUS A:   CAP (NOS) Septic shock-source likely PNA, r/o urine, bacteremia, veg Ct abdo at [facility] reported neg PCN allergy   Reminds me of toxic shock strep? Staph? P:   BCx2 9/18 ([place])>>> UC 9/18 ([place])>> Sputum 9/19>>> BC 9/19>>> U strep 9/19>> u legionella 9/19>>> 9/18 azithro & Ceftriaxone>>>off 9/19 vanc >>> 9/19 levaquin 9/19>>> 9/19 azactam 9/19>>> 9/19 RVP>>> 9/19 rapid flu >>>  If PCN allergy mild-moderate would change to IMipenem Limited flu in [place], holding tamiflu Will [place] chest Will have reorder CT images from [place] as disc did not work Send our UA May need BAL bronch Will re interview family for presentation Echo for veg Night Rn reported following commands, NO LP and see coags Add clinda toxin inhi  ENDOCRINE A:   Hypoglycemia  No evidence adrenal dysfxn P:   ssi cbg Dc roids  NEUROLOGIC A:   Acute encephalopathy in setting of sepsis Consider meningitis on d-dx but no report of nuchal rigidity or changes prior   >unsure of how much the versed gtt contributing.  CT head at [facility] w/out acute findings  P:   RASS goal: -2 to -3 with vent needs In setting of shock, dc precedex Add fent  FAMILY  - Updates: I have updated his wife and two daughters. At this point he is full code, but if things worsen (or if he were to arrest in spite of current measures) we would  need to revisit this   - Inter-disciplinary family meet or Palliative Care meeting due by: 9/26  Ccm time 60 min   [name] Rossetti. Tyson Alias, MD, FACP Pgr: [phone] [facility]

## 2015-07-06 ENCOUNTER — Inpatient Hospital Stay (HOSPITAL_COMMUNITY): Payer: BLUE CROSS/BLUE SHIELD

## 2015-07-06 DIAGNOSIS — E43 Unspecified severe protein-calorie malnutrition: Secondary | ICD-10-CM

## 2015-07-06 DIAGNOSIS — K7200 Acute and subacute hepatic failure without coma: Secondary | ICD-10-CM

## 2015-07-06 DIAGNOSIS — N19 Unspecified kidney failure: Secondary | ICD-10-CM

## 2015-07-06 DIAGNOSIS — A419 Sepsis, unspecified organism: Secondary | ICD-10-CM | POA: Insufficient documentation

## 2015-07-06 LAB — LACTIC ACID, PLASMA
LACTIC ACID, VENOUS: 16.5 mmol/L — AB (ref 0.5–2.0)
Lactic Acid, Venous: 17.1 mmol/L (ref 0.5–2.0)

## 2015-07-06 LAB — COMPREHENSIVE METABOLIC PANEL
ALBUMIN: 1.9 g/dL — AB (ref 3.5–5.0)
ALT: 1886 U/L — AB (ref 17–63)
AST: 4584 U/L — AB (ref 15–41)
Alkaline Phosphatase: 131 U/L — ABNORMAL HIGH (ref 38–126)
Anion gap: 22 — ABNORMAL HIGH (ref 5–15)
BUN: 19 mg/dL (ref 6–20)
CALCIUM: 6.4 mg/dL — AB (ref 8.9–10.3)
CHLORIDE: 99 mmol/L — AB (ref 101–111)
CO2: 11 mmol/L — AB (ref 22–32)
Creatinine, Ser: 2.17 mg/dL — ABNORMAL HIGH (ref 0.61–1.24)
GFR calc Af Amer: 36 mL/min — ABNORMAL LOW (ref >60)
GFR calc non Af Amer: 31 mL/min — ABNORMAL LOW (ref >60)
GLUCOSE: 126 mg/dL — AB (ref 65–99)
Potassium: 5.2 mmol/L — ABNORMAL HIGH (ref 3.5–5.1)
SODIUM: 132 mmol/L — AB (ref 135–145)
TOTAL PROTEIN: 6.3 g/dL — AB (ref 6.5–8.1)
Total Bilirubin: 5.2 mg/dL — ABNORMAL HIGH (ref 0.3–1.2)

## 2015-07-06 LAB — POCT I-STAT 3, ART BLOOD GAS (G3+)
ACID-BASE DEFICIT: 15 mmol/L — AB (ref 0.0–2.0)
ACID-BASE DEFICIT: 15 mmol/L — AB (ref 0.0–2.0)
Acid-base deficit: 17 mmol/L — ABNORMAL HIGH (ref 0.0–2.0)
BICARBONATE: 11.8 meq/L — AB (ref 20.0–24.0)
BICARBONATE: 10.6 meq/L — AB (ref 20.0–24.0)
Bicarbonate: 11.3 mEq/L — ABNORMAL LOW (ref 20.0–24.0)
O2 SAT: 96 %
O2 Saturation: 97 %
O2 Saturation: 95 %
PCO2 ART: 29.7 mmHg — AB (ref 35.0–45.0)
PH ART: 7.159 — AB (ref 7.350–7.450)
PO2 ART: 86 mmHg (ref 80.0–100.0)
PO2 ART: 96 mmHg (ref 80.0–100.0)
TCO2: 13 mmol/L (ref 0–100)
TCO2: 12 mmol/L (ref 0–100)
TCO2: 11 mmol/L (ref 0–100)
pCO2 arterial: 27.9 mmHg — ABNORMAL LOW (ref 35.0–45.0)
pCO2 arterial: 27.6 mmHg — ABNORMAL LOW (ref 35.0–45.0)
pH, Arterial: 7.224 — ABNORMAL LOW (ref 7.350–7.450)
pH, Arterial: 7.218 — ABNORMAL LOW (ref 7.350–7.450)
pO2, Arterial: 109 mmHg — ABNORMAL HIGH (ref 80.0–100.0)

## 2015-07-06 LAB — RESPIRATORY VIRUS PANEL
ADENOVIRUS: NEGATIVE
INFLUENZA B 1: NEGATIVE
Influenza A: NEGATIVE
METAPNEUMOVIRUS: NEGATIVE
Parainfluenza 1: NEGATIVE
Parainfluenza 2: NEGATIVE
Parainfluenza 3: NEGATIVE
RESPIRATORY SYNCYTIAL VIRUS A: NEGATIVE
RESPIRATORY SYNCYTIAL VIRUS B: NEGATIVE
RHINOVIRUS: NEGATIVE

## 2015-07-06 LAB — CULTURE, RESPIRATORY

## 2015-07-06 LAB — MAGNESIUM: MAGNESIUM: 2.5 mg/dL — AB (ref 1.7–2.4)

## 2015-07-06 LAB — CBC
HCT: 44.1 % (ref 39.0–52.0)
Hemoglobin: 14.3 g/dL (ref 13.0–17.0)
MCH: 30.6 pg (ref 26.0–34.0)
MCHC: 32.4 g/dL (ref 30.0–36.0)
MCV: 94.4 fL (ref 78.0–100.0)
PLATELETS: 15 10*3/uL — AB (ref 150–400)
RBC: 4.67 MIL/uL (ref 4.22–5.81)
RDW: 18.3 % — AB (ref 11.5–15.5)
WBC: 15.9 10*3/uL — AB (ref 4.0–10.5)

## 2015-07-06 LAB — CARBOXYHEMOGLOBIN
Carboxyhemoglobin: 0.5 % (ref 0.5–1.5)
Carboxyhemoglobin: 0.1 % — ABNORMAL LOW (ref 0.5–1.5)
Methemoglobin: 0.8 % (ref 0.0–1.5)
Methemoglobin: 0.9 % (ref 0.0–1.5)
O2 SAT: 67.1 %
O2 Saturation: 69.8 %
TOTAL HEMOGLOBIN: 13.9 g/dL (ref 13.5–18.0)
Total hemoglobin: 14.4 g/dL (ref 13.5–18.0)

## 2015-07-06 LAB — URINE MICROSCOPIC-ADD ON

## 2015-07-06 LAB — HEPATITIS PANEL, ACUTE
HEP A IGM: NEGATIVE
Hep B C IgM: NEGATIVE
Hepatitis B Surface Ag: NEGATIVE

## 2015-07-06 LAB — CBC WITH DIFFERENTIAL/PLATELET
BASOS ABS: 0 10*3/uL (ref 0.0–0.1)
Basophils Relative: 0 %
EOS ABS: 0 10*3/uL (ref 0.0–0.7)
Eosinophils Relative: 0 %
HCT: 41.2 % (ref 39.0–52.0)
HEMOGLOBIN: 13.2 g/dL (ref 13.0–17.0)
LYMPHS PCT: 3 %
Lymphs Abs: 0.5 10*3/uL — ABNORMAL LOW (ref 0.7–4.0)
MCH: 30 pg (ref 26.0–34.0)
MCHC: 32 g/dL (ref 30.0–36.0)
MCV: 93.6 fL (ref 78.0–100.0)
MONOS PCT: 7 %
Monocytes Absolute: 1.1 10*3/uL — ABNORMAL HIGH (ref 0.1–1.0)
NEUTROS ABS: 14.1 10*3/uL — AB (ref 1.7–7.7)
NEUTROS PCT: 90 %
Platelets: 14 10*3/uL — CL (ref 150–400)
RBC: 4.4 MIL/uL (ref 4.22–5.81)
RDW: 18 % — ABNORMAL HIGH (ref 11.5–15.5)
WBC Morphology: INCREASED
WBC: 15.7 10*3/uL — AB (ref 4.0–10.5)

## 2015-07-06 LAB — CULTURE, RESPIRATORY W GRAM STAIN: Gram Stain: NONE SEEN

## 2015-07-06 LAB — GLUCOSE, CAPILLARY
GLUCOSE-CAPILLARY: 156 mg/dL — AB (ref 65–99)
GLUCOSE-CAPILLARY: 189 mg/dL — AB (ref 65–99)
GLUCOSE-CAPILLARY: 84 mg/dL (ref 65–99)
GLUCOSE-CAPILLARY: 97 mg/dL (ref 65–99)
Glucose-Capillary: 134 mg/dL — ABNORMAL HIGH (ref 65–99)
Glucose-Capillary: 89 mg/dL (ref 65–99)

## 2015-07-06 LAB — RENAL FUNCTION PANEL
ALBUMIN: 1.9 g/dL — AB (ref 3.5–5.0)
ALBUMIN: 1.8 g/dL — AB (ref 3.5–5.0)
ANION GAP: 20 — AB (ref 5–15)
Anion gap: 25 — ABNORMAL HIGH (ref 5–15)
BUN: 21 mg/dL — ABNORMAL HIGH (ref 6–20)
BUN: 13 mg/dL (ref 6–20)
CALCIUM: 6.4 mg/dL — AB (ref 8.9–10.3)
CALCIUM: 6.2 mg/dL — AB (ref 8.9–10.3)
CHLORIDE: 94 mmol/L — AB (ref 101–111)
CO2: 12 mmol/L — ABNORMAL LOW (ref 22–32)
CO2: 10 mmol/L — ABNORMAL LOW (ref 22–32)
CREATININE: 1.49 mg/dL — AB (ref 0.61–1.24)
Chloride: 98 mmol/L — ABNORMAL LOW (ref 101–111)
Creatinine, Ser: 2.39 mg/dL — ABNORMAL HIGH (ref 0.61–1.24)
GFR calc non Af Amer: 28 mL/min — ABNORMAL LOW (ref >60)
GFR, EST AFRICAN AMERICAN: 32 mL/min — AB (ref >60)
GFR, EST AFRICAN AMERICAN: 57 mL/min — AB (ref >60)
GFR, EST NON AFRICAN AMERICAN: 49 mL/min — AB (ref >60)
GLUCOSE: 170 mg/dL — AB (ref 65–99)
Glucose, Bld: 90 mg/dL (ref 65–99)
PHOSPHORUS: 8.6 mg/dL — AB (ref 2.5–4.6)
PHOSPHORUS: 9.3 mg/dL — AB (ref 2.5–4.6)
POTASSIUM: 5.5 mmol/L — AB (ref 3.5–5.1)
Potassium: 6.1 mmol/L (ref 3.5–5.1)
SODIUM: 129 mmol/L — AB (ref 135–145)
Sodium: 130 mmol/L — ABNORMAL LOW (ref 135–145)

## 2015-07-06 LAB — URINALYSIS, ROUTINE W REFLEX MICROSCOPIC
Bilirubin Urine: NEGATIVE
GLUCOSE, UA: NEGATIVE mg/dL
KETONES UR: NEGATIVE mg/dL
LEUKOCYTES UA: NEGATIVE
NITRITE: NEGATIVE
PH: 6.5 (ref 5.0–8.0)
Protein, ur: 100 mg/dL — AB
Specific Gravity, Urine: 1.005 (ref 1.005–1.030)
Urobilinogen, UA: 0.2 mg/dL (ref 0.0–1.0)

## 2015-07-06 LAB — PREPARE PLATELET PHERESIS
UNIT DIVISION: 0
Unit division: 0

## 2015-07-06 LAB — FIBRINOGEN: FIBRINOGEN: 309 mg/dL (ref 204–475)

## 2015-07-06 LAB — EHRLICHIA ANTIBODY PANEL
E CHAFFEENSIS AB, IGG: NEGATIVE
E chaffeensis (HGE) Ab, IgM: NEGATIVE
E. Chaffeensis (HME) IgM Titer: NEGATIVE
E.Chaffeensis (HME) IgG: NEGATIVE

## 2015-07-06 LAB — STREP PNEUMONIAE URINARY ANTIGEN: Strep Pneumo Urinary Antigen: NEGATIVE

## 2015-07-06 LAB — PROCALCITONIN: PROCALCITONIN: 160.33 ng/mL

## 2015-07-06 LAB — APTT: APTT: 41 s — AB (ref 24–37)

## 2015-07-06 LAB — PROTIME-INR
INR: 3.26 — AB (ref 0.00–1.49)
Prothrombin Time: 32.6 seconds — ABNORMAL HIGH (ref 11.6–15.2)

## 2015-07-06 LAB — ROCKY MTN SPOTTED FVR ABS PNL(IGG+IGM)
RMSF IgG: NEGATIVE
RMSF IgM: 0.18 index (ref 0.00–0.89)

## 2015-07-06 LAB — HIV ANTIBODY (ROUTINE TESTING W REFLEX): HIV SCREEN 4TH GENERATION: NONREACTIVE

## 2015-07-06 MED ORDER — DOPAMINE-DEXTROSE 3.2-5 MG/ML-% IV SOLN
0.0000 ug/kg/min | INTRAVENOUS | Status: DC
  Administered 2015-07-06: 50 ug/kg/min via INTRAVENOUS
  Administered 2015-07-06: 3 ug/kg/min via INTRAVENOUS
  Administered 2015-07-06: 50 ug/kg/min via INTRAVENOUS
  Filled 2015-07-06 (×2): qty 250

## 2015-07-06 MED ORDER — SODIUM BICARBONATE 8.4 % IV SOLN
INTRAVENOUS | Status: AC
  Filled 2015-07-06: qty 200

## 2015-07-06 MED ORDER — HEPARIN SODIUM (PORCINE) 1000 UNIT/ML DIALYSIS
1000.0000 [IU] | INTRAMUSCULAR | Status: DC | PRN
  Filled 2015-07-06: qty 6

## 2015-07-06 MED ORDER — PRISMASOL BGK 0/2.5 32-2.5 MEQ/L IV SOLN
INTRAVENOUS | Status: DC
Start: 2015-07-06 — End: 2015-07-06
  Administered 2015-07-06: 18:00:00 via INTRAVENOUS_CENTRAL
  Filled 2015-07-06 (×3): qty 5000

## 2015-07-06 MED ORDER — SODIUM BICARBONATE 8.4 % IV SOLN
200.0000 meq | Freq: Once | INTRAVENOUS | Status: AC
  Filled 2015-07-06: qty 200

## 2015-07-06 MED ORDER — EPINEPHRINE HCL 0.1 MG/ML IJ SOSY
PREFILLED_SYRINGE | INTRAMUSCULAR | Status: AC
  Filled 2015-07-06: qty 10

## 2015-07-06 MED ORDER — METOCLOPRAMIDE HCL 5 MG/ML IJ SOLN
5.0000 mg | Freq: Two times a day (BID) | INTRAMUSCULAR | Status: DC
  Administered 2015-07-06 (×2): 5 mg via INTRAVENOUS
  Filled 2015-07-06 (×2): qty 1

## 2015-07-06 MED ORDER — STERILE WATER FOR INJECTION IV SOLN
INTRAVENOUS | Status: DC
  Administered 2015-07-06: 21:00:00 via INTRAVENOUS_CENTRAL
  Filled 2015-07-06 (×4): qty 150

## 2015-07-06 MED ORDER — ATROPINE SULFATE 0.1 MG/ML IJ SOLN
INTRAMUSCULAR | Status: AC
  Filled 2015-07-06: qty 10

## 2015-07-06 MED ORDER — DOPAMINE-DEXTROSE 3.2-5 MG/ML-% IV SOLN
INTRAVENOUS | Status: AC
  Filled 2015-07-06: qty 250

## 2015-07-06 MED ORDER — EPINEPHRINE HCL 1 MG/ML IJ SOLN
0.5000 ug/min | INTRAMUSCULAR | Status: DC
  Administered 2015-07-06 (×2): 20 ug/min via INTRAVENOUS
  Administered 2015-07-06: 30 ug/min via INTRAVENOUS
  Filled 2015-07-06 (×4): qty 4

## 2015-07-06 MED ORDER — PRISMASOL BGK 0/2.5 32-2.5 MEQ/L IV SOLN
INTRAVENOUS | Status: DC
  Administered 2015-07-06: 18:00:00 via INTRAVENOUS_CENTRAL
  Filled 2015-07-06 (×2): qty 5000

## 2015-07-06 NOTE — Progress Notes (Signed)
CRITICAL VALUE ALERT  Critical value received:  La 16.5  Date of notification:  07/06/15  Time of notification:  2145  Critical value read back:Yes.    Nurse who received alert:  km  Responding MD:  elink  Time MD responded:  2145

## 2015-07-06 NOTE — Progress Notes (Signed)
CRITICAL VALUE ALERT  Critical value received:  Ca = 5.5  Date of notification:  07/05/15  Time of notification:2147  Critical value read back:Yes.    Nurse who received alert:  Dustin Flock  MD notified (1st page):  Dr Shela Commons Deterding  Time of first page: 2215  MD notified (2nd page):  Time of second page:  Responding MD:  Dr Lahoma Crocker  Time MD responded:  2225

## 2015-07-06 NOTE — Progress Notes (Signed)
CRITICAL VALUE ALERT  Critical value received:  Ca= 6.4  Date of notification:  07/06/15  Time of notification:  0221  Critical value read back:Yes.    Nurse who received alert:  Dustin Flock  MD notified (1st page):  Dr Shela Commons Deterding  Time of first page:  0230  MD notified (2nd page):  Time of second page:  Responding MD:  Dr Shela Commons deterding  Time MD responded:  0230

## 2015-07-06 NOTE — Significant Event (Signed)
CRITICAL VALUE ALERT  Critical value received:  Potassium 6.1 and Calcium 6.2  Date of notification:  07/06/15  Time of notification:  1632  Critical value read back: yes  Nurse who received alert:  Burnard Bunting, RN  MD notified (1st page):  Dr Eliott Nine  Time of first page: 1634  Time MD responded:  1637

## 2015-07-06 NOTE — Progress Notes (Signed)
PULMONARY / CRITICAL CARE MEDICINE   Name: Johnny Harris MRN: [id number] DOB: 03-10-1956    ADMISSION DATE:  06/29/2015   REFERRING MD :  [facility]   CHIEF COMPLAINT:  Septic shock   INITIAL PRESENTATION:  59 year old male initially presented to [place] ER on 9/18 w/ <1d h/o AMS. To be admitted w/ working dx of septic shock in setting of presumed PNA. Developed worsening LOC, respiratory failure, acidosis and renal failure. Transferred to [facility] after being intubated. On arrival working dx of: CAP-->resp failure; septic shock-->MODS-->renal failure, metabolic acidosis and profound thrombocytopenia.   STUDIES:  CT chest 9/19: bilateral infiltrates R>L, 2.7 cm  opacity right lung base.  CT abd/pelvis 9/19: negative for obstruction. No other acute findings. Except delayed renal nephrograms raising concern about either poor renal fxn vs pyelo  Echo 9/20- EF 25-30%, no veg, grade 3 diast, akinesis of the entireinferior myocardium  SIGNIFICANT EVENTS: 9/19- cvvhd, acidosis 9/20-pressors required still, severe thrombocytopenia, IVIG started  SUBJECTIVE: Sedated. OGT w/ mild bloody output.   VITAL SIGNS: Temp:  [91.9 F (33.3 C)-94.9 F (34.9 C)] 94.8 F (34.9 C) (09/21 0400) Pulse Rate:  [61-95] 61 (09/21 0759) Resp:  [25-30] 30 (09/21 0759) BP: (90-118)/(15-88) 90/66 mmHg (09/21 0759) SpO2:  [100 %] 100 % (09/21 0430) Arterial Line BP: (80-147)/(53-100) 92/66 mmHg (09/21 0700) FiO2 (%):  [40 %] 40 % (09/21 0759) Weight:  [236 lb 1.8 oz (107.1 kg)] 236 lb 1.8 oz (107.1 kg) (09/21 0430)   HEMODYNAMICS: CVP:  [9 mmHg-10 mmHg] 9 mmHg   VENTILATOR SETTINGS: Vent Mode:  [-] PRVC FiO2 (%):  [40 %] 40 % Set Rate:  [30 bmp] 30 bmp Vt Set:  [620 mL] 620 mL PEEP:  [5 cmH20] 5 cmH20 Plateau Pressure:  [19 cmH20-22 cmH20] 22 cmH20   INTAKE / OUTPUT:  Intake/Output Summary (Last 24 hours) at 07/06/15 0834 Last data filed at 07/06/15 0800  Gross per 24 hour  Intake 5318.75 ml   Output   5203 ml  Net 115.75 ml    PHYSICAL EXAMINATION: General: White male, intubated, sedated Neuro: Sedated. RASS -1/-2 HEENT: PERRL, left pupil irreg. ett, jvd wnl. Dark mottled skin extending up onto neck.  PULM: coarse clear CV: RRR. GI: soft, bs hypo, Nt, ND, no r/g Extremities: Extensive rash/mottled appearance of skin extending onto neck. +1 pitting edema.  LABS:  CBC  Recent Labs Lab 06/30/2015 1339 06/25/2015 1500 07/05/15 0855 07/06/15 0450  WBC 26.6*  --  12.6* 15.7*  HGB 17.6*  --  10.0* 13.2  HCT 54.6*  --  30.2* 41.2  PLT 18* 18* 5* 14*   Coag's  Recent Labs Lab 06/25/2015 1339 06/22/2015 1500  APTT 54* 51*  INR 2.89* 2.88*   BMET  Recent Labs Lab 07/05/15 2114 07/06/15 0154 07/06/15 0450  NA 125* 130* 132*  K 5.5* 5.5* 5.2*  CL 92* 98* 99*  CO2 11* 12* 11*  BUN 25* 21* 19  CREATININE 2.79* 2.39* 2.17*  GLUCOSE 140* 170* 126*   Electrolytes  Recent Labs Lab 07/05/15 0524 07/05/15 1600 07/05/15 2114 07/06/15 0154 07/06/15 0450  CALCIUM 6.1* 6.0* 5.5* 6.4* 6.4*  MG 1.8  --   --   --  2.5*  PHOS 7.9* 10.2* 9.4* 8.6*  --    Sepsis Markers  Recent Labs Lab 07/03/2015 1339 06/27/2015 1451 07/01/2015 1500  LATICACIDVEN 18.5*  --  4.5*  PROCALCITON  --  >175.00  --    ABG  Recent Labs  Lab 07/05/15 0807 07/05/15 1146 07/06/15 0350  PHART 7.231* 7.195* 7.224*  PCO2ART 42.5 32.6* 27.9*  PO2ART 143.0* 96.0 86.0   Liver Enzymes  Recent Labs Lab 06/27/2015 1452  07/05/15 2114 07/06/15 0154 07/06/15 0450  AST 488*  --   --   --  4584*  ALT 153*  --   --   --  1886*  ALKPHOS 35*  --   --   --  131*  BILITOT 2.6*  --   --   --  5.2*  ALBUMIN 2.3*  < > 1.8* 1.9* 1.9*  < > = values in this interval not displayed.   Glucose  Recent Labs Lab 07/05/15 0402 07/05/15 0844 07/05/15 1208 07/05/15 1629 07/05/15 1943 07/05/15 2346  GLUCAP 142* 170* 161* 131* 126* 156*    Imaging Dg Chest Port 1 View  07/06/2015   CLINICAL  DATA:  subsequent encounter pneumonia  EXAM: PORTABLE CHEST - 1 VIEW  COMPARISON:  07/05/2015  FINDINGS: Hazy density at both lung bases unchanged suggests a combination of consolidation or pleural effusion. Lines and tubes unchanged. Stable mild cardiac enlargement.  IMPRESSION: Stable bibasilar opacities   Electronically Signed   By: Skipper Cliche M.D.   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 P:   PPI Tube Feeds started but concern residuals, add reglan Repeat LFT's in AM Hep panel awaited [place] abdo correlation, assess abscess KUB  HEMATOLOGIC A:   Sepsis induced severe thrombocytopenia; given total 4 units pltls Leukocytosis Coagulopathy; given total 2 units FFP DIC component likely P:  Trend CBC PAS Transfuse per usual ICU protocol  LFT's in AM further Get coags  INFECTIOUS A:   CAP  Septic shock-source likely PNA PCN allergy  TSS? Meningitis? ECHO NEG for Veg P:   BCx2 9/18 ([place]) >>> UC 9/18 ([place]) >> Sputum 9/19 >>> BC 9/19 >>> 9/19  RVP>>> NEG 9/19 rapid flu >>> NEG U strep 9/19 >>> U legionella 9/19>>> 9/18 azithro & Ceftriaxone>>>off 9/19 vanc >>> 9/19 levaquin 9/19>>> 9/20 9/19 azactam 9/19>>> 9/20  9/20 meropenem >>> 9/20 Clinda >>> 9/20 Doxy >>>  Too sick to bronch Get abdo [place] Appreciate ID help IV IG repeat dose  ENDOCRINE A:   Hypoglycemia  No evidence adrenal dysfxn P:   CBG q4h  NEUROLOGIC A:   Acute encephalopathy in setting of sepsis Consider meningitis although family reports no neck pain, headache P:   RASS goal: -2 to -3 with vent needs Avoid benzo  Natasha Bence, MD PGY-3, Internal Medicine Pager: [phone]  STAFF NOTE: Linwood Dibbles, MD FACP have personally  reviewed patient's available data, including medical history, events of note, physical examination and test results as part of my evaluation. I have discussed with resident/NP and other care providers such as pharmacist, RN and RRT. In addition, I personally evaluated patient and elicited key findings of: MODS, high risk death, pcxr and exam do not account for source, appreciate ID help, possible ileus, add reglan, get KUB, keep high MV on ventr, levo to 5 then vas o dc if able, cvvhd noted, bicarb may need to dc if co2 rises in setting using for AG acidosis, LFt increased, likley shock state, re assess in am , get [place] abdo, await hep panel, get coags, re asess fibrinogen, cbc in am, I updated his wife and daughter, echo reviewed, get svo2, may need ionotropic support, repeat pct The patient is critically ill with multiple organ systems failure and requires high complexity decision making for assessment and support, frequent evaluation and titration of therapies, application of advanced monitoring technologies and extensive interpretation of multiple databases.   Critical Care Time devoted to patient care services described in this note is 45 Minutes. This time reflects time of care of this signee: Merrie Roof, MD FACP. This  critical care time does not reflect procedure time, or teaching time or supervisory time of PA/NP/Med student/Med Resident etc but could involve care discussion time. Rest per NP/medical resident whose note is outlined above and that I agree with   Lavon Paganini. Titus Mould, MD, [place] Pgr: [facility] 07/06/2015 10:05 AM

## 2015-07-06 NOTE — Progress Notes (Addendum)
eLink Physician-Brief Progress Note Patient Name: Johnny Harris DOB: 04/06/1956 MRN: [id number]   Date of Service  07/06/2015  HPI/Events of Note  Patient is currently hypotensive (SBP = 80's) and bradycardic (HR in 30's) on Norepinephrine, Dopamine and Epinephrine IV infusions. AGB = 7.159/29.7/96.7/10.6 and Lactic Acid = 17.1. Coox = 67% Already on a NaHCO3 IV infusion and CVVHD. He is failing on maximal aggressive support. Prognosis grim. Patient is now a DNR.  eICU Interventions  Will order: 1. NaHCO3 200 meq IV now.     Intervention Category Major Interventions: Arrhythmia - evaluation and management;Hypotension - evaluation and management;Shock - evaluation and management;Sepsis - evaluation and management;Acid-Base disturbance - evaluation and management  Sommer,Steven Eugene 07/06/2015, 8:17 PM

## 2015-07-06 NOTE — Progress Notes (Signed)
PCCM Interval Progress Note  59 y.o. M admitted with profound septic shock (? Toxic shock, ? meningitis) and now with MODS, profound metabolic acidosis, probable DIC.  BP has remained low all day (SBP currently 63 on arterial line) despite 3 vasopressors (Norepinephrine, Vasopressin, Dopamine) and HCO3 gtt.  Lactic acid has returned at 17 (4.5 one day prior).  Extensive mottling throughout body and now with skin sloughing / shedding.  In right leg, NO pulses are appreciated via both palpation and doppler (DP, PT, popliteal, femoral).  In left leg, only a femoral pulse is dopplerable.  Extensive bedside discussion with pt's wife, sister in law, and mother in law.  We discussed Mr. Peace current circumstances and organ failures and family understands the degree / severity of this.  I explained that heroic measures are likely futile in this case and they are in agreement.  All questions answered and pt's wife is to have further discussions with two daughters regarding plan of care moving forward.   Additional CC time:  20 minutes.   Rutherford Guys, [place] - [facility] [place] [facility] Pager: [phone]  or [phone] 07/06/2015, 6:22 PM

## 2015-07-06 NOTE — Progress Notes (Signed)
[facility] Rounding Note  Subjective:  Worsening acidosis and hyperkalemic over night Now on zero potassium dialysate LFT's worsening Bloody drainage from NG tube  Objective Vital signs in last 24 hours: Filed Vitals:   07/06/15 0630 07/06/15 0645 07/06/15 0700 07/06/15 0759  BP:   118/24 90/66  Pulse:    61  Temp:      TempSrc:      Resp: Height:      Weight:      SpO2:       Weight change: 3.7 kg (8 lb 2.5 oz)  Intake/Output Summary (Last 24 hours) at 07/06/15 0813 Last data filed at 07/06/15 0700  Gross per 24 hour  Intake 5318.75 ml  Output   5079 ml  Net 239.75 ml   Physical Exam:  BP 90/66 mmHg  Pulse 61  Temp(Src) 94.8 F (34.9 C) (Oral)  Resp 30  Ht 6' (1.829 m)  Wt 107.1 kg (236 lb 1.8 oz)  BMI 32.02 kg/m2  SpO2 100%  Gen: Intubated WM, mottled from head to toe - unchanged ETT in place Chest: Regular S1S2 no s3 Lungs with bilateral coarse rhonchi Abdomen: soft, distended, quiet, mottled Ext: Mottled extremities, cold Neuro: Sedated R IJ temp cath (9/19) in place no issues  Labs: Basic Metabolic Panel:  Recent Labs Lab 07/15/2015 1452 07/05/15 0524 07/05/15 1600 07/05/15 2114 07/06/15 0154 07/06/15 0450  NA 136 133* 134* 125* 130* 132*  K 6.3* 5.0 6.2* 5.5* 5.5* 5.2*  CL 102 100* 99* 92* 98* 99*  CO2 20* 17* 11* 11* 12* 11*  GLUCOSE 123* 169* 145* 140* 170* 126*  BUN 31* 35* 29* 25* 21* 19  CREATININE 4.05* 3.87* 3.02* 2.79* 2.39* 2.17*  CALCIUM 6.0* 6.1* 6.0* 5.5* 6.4* 6.4*  PHOS  --  7.9* 10.2* 9.4* 8.6*  --      Recent Labs Lab 07/09/2015 1452  07/05/15 2114 07/06/15 0154 07/06/15 0450  AST 488*  --   --   --  4584*  ALT 153*  --   --   --  1886*  ALKPHOS 35*  --   --   --  131*  BILITOT 2.6*  --   --   --  5.2*  PROT 4.7*  --   --   --  6.3*  ALBUMIN 2.3*  < > 1.8* 1.9* 1.9*  < > = values in this interval not displayed.   Recent Labs Lab 06/28/2015 1339 06/17/2015 1500 07/05/15 0855  07/06/15 0450  WBC 26.6*  --  12.6* 15.7*  NEUTROABS  --   --   --  14.1*  HGB 17.6*  --  10.0* 13.2  HCT 54.6*  --  30.2* 41.2  MCV 97.7  --  92.4 93.6  PLT 18* 18* 5* 14*   CBG:  Recent Labs Lab 07/05/15 0844 07/05/15 1208 07/05/15 1629 07/05/15 1943 07/05/15 2346  GLUCAP 170* 161* 131* 126* 156*   9/19 Venous lactic acid 18.5 ->4.5         AST 488         ALT 153 Results for Johnny Harris, Johnny Harris (MRN [id number]) as of 07/06/2015 08:13  Ref. Range 07/06/2015 03:50  pH, Arterial Latest Ref Range: 7.350-7.450  7.224 (L)  pCO2 arterial Latest Ref Range: 35.0-45.0 mmHg 27.9 (L)  pO2, Arterial Latest Ref Range: 80.0-100.0 mmHg 86.0  Bicarbonate Latest Ref Range: 20.0-24.0 mEq/L 11.8 (L)  TCO2 Latest Ref Range: 0-100 mmol/L 13  Acid-base deficit  Latest Ref Range: 0.0-2.0 mmol/L 15.0 (H)  O2 Saturation Latest Units: % 96.0  Patient temperature Unknown 94.8 F    Results for Johnny, Harris (MRN [id number]) as of 07/03/2015 17:32  Ref. Range 06/30/2015 15:00  Platelets Latest Ref Range: 150-400 K/uL 18 (LL)  Smear Review Unknown NO SCHISTOCYTES SEEN  D-Dimer, Quant Latest Ref Range: 0.00-0.48 ug/mL-FEU >20.00 (H)  Fibrinogen Latest Ref Range: 204-475 mg/dL 272  Prothrombin Time Latest Ref Range: 11.6-15.2 seconds 29.7 (H)  INR Latest Ref Range: 0.00-1.49  2.88 (H)  APTT Latest Ref Range: 24-37 seconds 51 (H)   Results for Harris, Johnny (MRN [id number]) as of 06/18/2015 17:32  Ref. Range 06/24/2015 14:51  Procalcitonin Latest Units: ng/mL >175.00       Results for Harris, Johnny (MRN [id number]) as of 07/05/2015 07:43  Ref. Range 07/11/2015 15:30  Cortisol, Plasma Latest Units: ug/dL 75.6   Studies/Results: US Renal Port  07/10/2015   CLINICAL DATA:  Acute renal failure.  EXAM: RENAL / URINARY TRACT ULTRASOUND COMPLETE  COMPARISON:  None.  FINDINGS: Right Kidney:  Length: 11 cm. Cortical echogenicity and thickness appear within normal limits without  mass or cyst. No renal stone or hydronephrosis.  Left Kidney:  Length: 11.7 cm. Cortical echogenicity and thickness appear within normal limits without mass or cyst. No renal stone or hydronephrosis.  Bladder:  Decompressed by Foley catheter.  IMPRESSION: Kidneys appear normal.  No hydronephrosis.  Bladder decompressed by Foley catheter.   Electronically Signed   By: Bary Richard M.D.   On: 06/27/2015 16:09   Portable Chest Xray  07/05/2015   CLINICAL DATA:  Pneumonia.  Shortness of breath.  EXAM: PORTABLE CHEST - 1 VIEW  COMPARISON:  07/01/2015.  FINDINGS: Endotracheal tube, NG tube, bilateral IJ lines in stable position. Stable cardiomegaly. Slight progression of bibasilar atelectasis and/or infiltrates. Small pleural effusions cannot be completely excluded. No pneumothorax.  IMPRESSION: 1. Lines and tubes in stable position. 2. Slight progression of bibasilar atelectasis and/or infiltrates. Small pleural effusions cannot be excluded. 3. Stable cardiomegaly.   Electronically Signed   By: Maisie Fus  Register   On: 07/05/2015 07:23   Dg Chest Port 1 View  07/14/2015   CLINICAL DATA:  Central line placement portable exam 2238 hr compared to 06/17/2015  EXAM: PORTABLE CHEST - 1 VIEW  COMPARISON:  None.  FINDINGS: Tip of endotracheal tube projects 10.2 cm above carina.  Nasogastric tube extends into stomach.  New RIGHT jugular central venous catheter with tip projecting over proximal SVC.  LEFT jugular line tip projects over confluence of the SVC with LEFT brachiocephalic vein.  Enlargement of cardiac silhouette with vascular congestion.  Bibasilar atelectasis and questionable small pleural effusions.  No pneumothorax.  IMPRESSION: No pneumothorax following central line placement.  Line and tube positions as above.  Bibasilar atelectasis and questionable small pleural effusions.   Electronically Signed   By: Ulyses Southward M.D.   On: 06/28/2015 23:56   Dg Chest Port 1 View  06/17/2015   CLINICAL DATA:  Central line  placement.  EXAM: PORTABLE CHEST - 1 VIEW  COMPARISON:  07/14/2015  FINDINGS: Endotracheal tube is in place with tip 7.4 cm above carina. Nasogastric tube is in place, tip beyond the imaged and beyond the gastroesophageal junction. Left IJ central line has been placed, tip overlying the level of superior vena cava.  The heart is enlarged. There is bibasilar opacity, obscuring the medial aspect of the hemidiaphragms bilaterally. There is no evidence for pneumothorax.  IMPRESSION: 1.  Interval placement of left IJ central line. 2. Interval placement of nasogastric tube, tip off the film. 3. No evidence for postprocedure pneumothorax.   Electronically Signed   By: Norva Pavlov M.D.   On: 07/10/2015 17:03   Dg Chest Port 1 View  10-Jul-2015   CLINICAL DATA:  59 year old male with pneumonia. Endotracheal tube placement.  EXAM: PORTABLE CHEST - 1 VIEW  COMPARISON:  None  FINDINGS: An endotracheal tube is present with tip 7.2 cm above the carina.  Upper limits normal heart size noted.  Bibasilar atelectasis/airspace disease and possible small pleural effusions noted.  There is no evidence of pneumothorax or pulmonary edema  No acute bony abnormalities are identified. Remote right-sided rib fractures are present.  IMPRESSION: Upper limits normal heart size with bibasilar atelectasis/airspace disease possible small pleural effusions.  Endotracheal tube with tip 7.2 cm above carina. Consider 1-2 cm advancement.   Electronically Signed   By: Harmon Pier M.D.   On: 07/10/2015 14:07   Medications: . fentaNYL infusion INTRAVENOUS 100 mcg/hr (07/06/15 0700)  . norepinephrine (LEVOPHED) Adult infusion 25 mcg/min (07/06/15 0743)  . dialysate (PRISMASATE) 2,000 mL/hr at 07/06/15 0756  . dialysis replacement fluid (prismasate) 800 mL/hr at 07/06/15 0512  . dialysis replacement fluid (prismasate) 700 mL/hr at 07/06/15 0604  .  sodium bicarbonate  infusion 1000 mL 50 mL/hr at 07/06/15 0755  . vasopressin (PITRESSIN)  infusion - *FOR SHOCK* 0.03 Units/min (07/06/15 0700)   . sodium chloride   Intravenous Once  . albuterol  2.5 mg Nebulization Q4H  . antiseptic oral rinse  7 mL Mouth Rinse 10 times per day  . chlorhexidine gluconate  15 mL Mouth Rinse BID  . clindamycin (CLEOCIN) IV  600 mg Intravenous Q8H  . doxycycline (VIBRAMYCIN) IV  100 mg Intravenous Q12H  . feeding supplement (PRO-STAT SUGAR FREE 64)  30 mL Per Tube TID  . feeding supplement (VITAL HIGH PROTEIN)  1,000 mL Per Tube Q24H  . fentaNYL (SUBLIMAZE) injection  50 mcg Intravenous Once  . Immune Globulin 10%  0.5 g/kg (Adjusted) Intravenous Q24 Hr x 2  . meropenem (MERREM) IV  1 g Intravenous 3 times per day  . pantoprazole (PROTONIX) IV  40 mg Intravenous QHS  . vancomycin  1,000 mg Intravenous Q24H     Background 59 y.o. year-old WM with background of tobacco use, GERD who presented to [facility] with 1 week illness w/cough, developed AMS. Presented to [facility] ED with AMS. Intubated for resp failure with bilateral pulm infiltrates. Transferred 9/19 to [facility] for further management of septic shock, MODS, renal failure, metabolic acidosis, profound thrombocytopenia. We were asked to see d/t oligoanuric renal failure, hyperkalemia. CRRT initiated Jul 10, 2015  Impression/Plan 1. AKI - septic/hemodynamic AKI with ATN. CRRT since 9/19.  Worsening hyperkalemia prompted change to 0K dialysate during the night. Worsening acidosis. Increase peripheral bicarb drip to 100 cc/hour. Still keeping volume even. 2. Metabolic acidosis - Increase bicarb drip 50->100/hour 3. Hypocalcemia - IV Ca prn (CCM has been ordering) 4. Septic shock - presumed d/t bilateral PNA. Pressors/steroids/ATB's per CCM 5. VDRF - per CCM 6. Abnl LFT's - presume shock liver 7. Sepsis induced severe thrombocytopenia  8. Coagulopathy  9. Hypoglycemia    Camille Bal, MD [facility] [phone] pager 07/06/2015, 8:13 AM

## 2015-07-06 NOTE — Progress Notes (Signed)
Chaplain responded to page from 23M to support family of pt who is rapidly declining. After speaking with Diplomatic Services operational officer and RN, I donned a mask and entered pt room. Pt's wife, two daughters, and a friend were at bedside. Pt's wife stated their pastor had been here earlier but she asked me to lead them in prayer and I did so. Family thanked me for coming.

## 2015-07-06 NOTE — Progress Notes (Signed)
Pt has persistent hyperkalemia 6.1 CRRT fluids all changed to zero potassium Getting max dialysate flow rate Persistent severe acidosis Bicarb drip increased to 150 ml/hour Not much else I can do to adjust his CRRT I note that CCM has met with family and they are to decide on plan of care.  Jamal Maes, MD [facility] [phone] Pager 07/06/2015, 6:40 PM

## 2015-07-06 NOTE — Significant Event (Signed)
CRITICAL VALUE ALERT  Critical value received:  Lactic acid 17.1  Date of notification:  07/06/15  Time of notification: 1734  Critical value read back:  yes  Nurse who received alert: Burnard Bunting, RN  MD notified:  Dr Tyson Alias   Time:  1478

## 2015-07-06 NOTE — Progress Notes (Signed)
INFECTIOUS DISEASE PROGRESS NOTE  ID: Johnny Harris is a 59 y.o. male with  Active Problems:   Septic shock   Acute respiratory failure with hypoxia   Acute renal failure   Pneumonia   Encounter for central line placement  Subjective: On pressors, vent, CRRT, heating blanket  Abtx:  Anti-infectives    Start     Dose/Rate Route Frequency Ordered Stop   07/06/15 1400  vancomycin (VANCOCIN) 1,500 mg in sodium chloride 0.9 % 500 mL IVPB  Status:  Discontinued     1,500 mg 250 mL/hr over 120 Minutes Intravenous Every 48 hours 07/12/2015 1627 06/27/2015 1753   07/06/15 1400  levofloxacin (LEVAQUIN) IVPB 500 mg  Status:  Discontinued     500 mg 100 mL/hr over 60 Minutes Intravenous Every 48 hours 07/03/2015 1627 07/01/2015 1753   07/05/15 2200  vancomycin (VANCOCIN) IVPB 1000 mg/200 mL premix     1,000 mg 200 mL/hr over 60 Minutes Intravenous Every 24 hours 07/03/2015 1753     07/05/15 2000  Levofloxacin (LEVAQUIN) IVPB 250 mg  Status:  Discontinued     250 mg 50 mL/hr over 60 Minutes Intravenous Every 24 hours 07/08/2015 1753 07/05/15 1031   07/05/15 1100  meropenem (MERREM) 500 mg in sodium chloride 0.9 % 50 mL IVPB  Status:  Discontinued     500 mg 100 mL/hr over 30 Minutes Intravenous Every 12 hours 07/05/15 1013 07/05/15 1029   07/05/15 1100  meropenem (MERREM) 1 g in sodium chloride 0.9 % 100 mL IVPB     1 g 200 mL/hr over 30 Minutes Intravenous 3 times per day 07/05/15 1029     07/05/15 1100  doxycycline (VIBRAMYCIN) 100 mg in dextrose 5 % 250 mL IVPB     100 mg 125 mL/hr over 120 Minutes Intravenous Every 12 hours 07/05/15 1031     07/05/15 0900  imipenem-cilastatin (PRIMAXIN) 500 mg in sodium chloride 0.9 % 100 mL IVPB  Status:  Discontinued     500 mg 200 mL/hr over 30 Minutes Intravenous Every 6 hours 07/05/15 0844 07/05/15 1013   07/05/15 0845  clindamycin (CLEOCIN) IVPB 600 mg     600 mg 100 mL/hr over 30 Minutes Intravenous Every 8 hours 07/05/15 0844     07/11/2015 2200   aztreonam (AZACTAM) 500 mg in dextrose 5 % 50 mL IVPB  Status:  Discontinued     500 mg 100 mL/hr over 30 Minutes Intravenous 3 times per day 07/05/2015 1627 06/29/2015 1753   06/20/2015 2200  aztreonam (AZACTAM) 1 g in dextrose 5 % 50 mL IVPB  Status:  Discontinued     1 g 100 mL/hr over 30 Minutes Intravenous 3 times per day 06/23/2015 1753 07/05/15 0844   06/24/2015 1330  levofloxacin (LEVAQUIN) IVPB 750 mg  Status:  Discontinued     750 mg 100 mL/hr over 90 Minutes Intravenous  Once 06/22/2015 1308 07/03/2015 1427   06/23/2015 1330  vancomycin (VANCOCIN) IVPB 1000 mg/200 mL premix  Status:  Discontinued     1,000 mg 200 mL/hr over 60 Minutes Intravenous  Once 07/10/2015 1308 06/23/2015 1427   06/23/2015 1330  vancomycin (VANCOCIN) 2,500 mg in sodium chloride 0.9 % 500 mL IVPB     2,500 mg 250 mL/hr over 120 Minutes Intravenous  Once 07/03/2015 1320 06/20/2015 1558   07/06/2015 1330  aztreonam (AZACTAM) 2 g in dextrose 5 % 50 mL IVPB     2 g 100 mL/hr over 30 Minutes Intravenous  Once 06/26/2015  1320 06/25/2015 1421   06/23/2015 1330  levofloxacin (LEVAQUIN) IVPB 750 mg     750 mg 100 mL/hr over 90 Minutes Intravenous  Once 06/26/2015 1320 06/24/2015 1625      Medications:  Scheduled: . sodium chloride   Intravenous Once  . albuterol  2.5 mg Nebulization Q4H  . antiseptic oral rinse  7 mL Mouth Rinse 10 times per day  . chlorhexidine gluconate  15 mL Mouth Rinse BID  . clindamycin (CLEOCIN) IV  600 mg Intravenous Q8H  . doxycycline (VIBRAMYCIN) IV  100 mg Intravenous Q12H  . feeding supplement (PRO-STAT SUGAR FREE 64)  30 mL Per Tube TID  . feeding supplement (VITAL HIGH PROTEIN)  1,000 mL Per Tube Q24H  . fentaNYL (SUBLIMAZE) injection  50 mcg Intravenous Once  . Immune Globulin 10%  0.5 g/kg (Adjusted) Intravenous Q24 Hr x 2  . meropenem (MERREM) IV  1 g Intravenous 3 times per day  . pantoprazole (PROTONIX) IV  40 mg Intravenous QHS  . vancomycin  1,000 mg Intravenous Q24H    Objective: Vital signs in last  24 hours: Temp:  [91.9 F (33.3 C)-94.9 F (34.9 C)] 94.8 F (34.9 C) (09/21 0400) Pulse Rate:  [63-95] 63 (09/21 0430) Resp:  [23-30] 30 (09/21 0700) BP: (93-118)/(15-88) 118/24 mmHg (09/21 0700) SpO2:  [100 %] 100 % (09/21 0430) Arterial Line BP: (80-147)/(53-100) 92/66 mmHg (09/21 0700) FiO2 (%):  [40 %-70 %] 40 % (09/21 0700) Weight:  [107.1 kg (236 lb 1.8 oz)] 107.1 kg (236 lb 1.8 oz) (09/21 0430)   General appearance: toxic Resp: diminished breath sounds anterior - bilateral Cardio: regular rate and rhythm GI: abnormal findings:  hypoactive bowel sounds and soft Extremities: mottling  Lab Results  Recent Labs  07/05/15 0855  07/06/15 0154 07/06/15 0450  WBC 12.6*  --   --  15.7*  HGB 10.0*  --   --  13.2  HCT 30.2*  --   --  41.2  NA  --   < > 130* 132*  K  --   < > 5.5* 5.2*  CL  --   < > 98* 99*  CO2  --   < > 12* 11*  BUN  --   < > 21* 19  CREATININE  --   < > 2.39* 2.17*  < > = values in this interval not displayed. Liver Panel  Recent Labs  07/15/2015 1452  07/06/15 0154 07/06/15 0450  PROT 4.7*  --   --  6.3*  ALBUMIN 2.3*  < > 1.9* 1.9*  AST 488*  --   --  4584*  ALT 153*  --   --  1886*  ALKPHOS 35*  --   --  131*  BILITOT 2.6*  --   --  5.2*  < > = values in this interval not displayed. Sedimentation Rate No results for input(s): ESRSEDRATE in the last 72 hours. C-Reactive Protein No results for input(s): CRP in the last 72 hours.  Microbiology: Recent Results (from the past 240 hour(s))  MRSA PCR Screening     Status: None   Collection Time: 06/24/2015  2:26 PM  Result Value Ref Range Status   MRSA by PCR NEGATIVE NEGATIVE Final    Comment:        The GeneXpert MRSA Assay (FDA approved for NASAL specimens only), is one component of a comprehensive MRSA colonization surveillance program. It is not intended to diagnose MRSA infection nor to guide or monitor treatment  for MRSA infections.   Respiratory virus panel     Status: None    Collection Time: 07-09-2015  3:19 PM  Result Value Ref Range Status   Respiratory Syncytial Virus A Negative Negative Final   Respiratory Syncytial Virus B Negative Negative Final   Influenza A Negative Negative Final   Influenza B Negative Negative Final   Parainfluenza 1 Negative Negative Final   Parainfluenza 2 Negative Negative Final   Parainfluenza 3 Negative Negative Final   Metapneumovirus Negative Negative Final   Rhinovirus Negative Negative Final   Adenovirus Negative Negative Final    Comment: (NOTE) Performed At: [facility] [address] [place], [place] [id number] Mila Homer MD NW:[id number]   Culture, respiratory (NON-Expectorated)     Status: None (Preliminary result)   Collection Time: Jul 09, 2015  3:58 PM  Result Value Ref Range Status   Specimen Description TRACHEAL ASPIRATE  Final   Special Requests NONE  Final   Gram Stain   Final    NO WBC SEEN NO SQUAMOUS EPITHELIAL CELLS SEEN NO ORGANISMS SEEN Performed at [facility]    Culture   Final    NO GROWTH 1 DAY Performed at [facility]    Report Status PENDING  Incomplete  C difficile quick scan w PCR reflex     Status: None   Collection Time: 07/05/15  8:56 AM  Result Value Ref Range Status   C Diff antigen NEGATIVE NEGATIVE Final   C Diff toxin NEGATIVE NEGATIVE Final   C Diff interpretation Negative for toxigenic C. difficile  Final    Studies/Results: US Renal Port  07-09-2015   CLINICAL DATA:  Acute renal failure.  EXAM: RENAL / URINARY TRACT ULTRASOUND COMPLETE  COMPARISON:  None.  FINDINGS: Right Kidney:  Length: 11 cm. Cortical echogenicity and thickness appear within normal limits without mass or cyst. No renal stone or hydronephrosis.  Left Kidney:  Length: 11.7 cm. Cortical echogenicity and thickness appear within normal limits without mass or cyst. No renal stone or hydronephrosis.  Bladder:  Decompressed by Foley catheter.  IMPRESSION: Kidneys appear normal.  No  hydronephrosis.  Bladder decompressed by Foley catheter.   Electronically Signed   By: Bary Richard M.D.   On: July 09, 2015 16:09   Portable Chest Xray  07/05/2015   CLINICAL DATA:  Pneumonia.  Shortness of breath.  EXAM: PORTABLE CHEST - 1 VIEW  COMPARISON:  2015/07/09.  FINDINGS: Endotracheal tube, NG tube, bilateral IJ lines in stable position. Stable cardiomegaly. Slight progression of bibasilar atelectasis and/or infiltrates. Small pleural effusions cannot be completely excluded. No pneumothorax.  IMPRESSION: 1. Lines and tubes in stable position. 2. Slight progression of bibasilar atelectasis and/or infiltrates. Small pleural effusions cannot be excluded. 3. Stable cardiomegaly.   Electronically Signed   By: Maisie Fus  Register   On: 07/05/2015 07:23   Dg Chest Port 1 View  2015/07/09   CLINICAL DATA:  Central line placement portable exam 2238 hr compared to July 09, 2015  EXAM: PORTABLE CHEST - 1 VIEW  COMPARISON:  None.  FINDINGS: Tip of endotracheal tube projects 10.2 cm above carina.  Nasogastric tube extends into stomach.  New RIGHT jugular central venous catheter with tip projecting over proximal SVC.  LEFT jugular line tip projects over confluence of the SVC with LEFT brachiocephalic vein.  Enlargement of cardiac silhouette with vascular congestion.  Bibasilar atelectasis and questionable small pleural effusions.  No pneumothorax.  IMPRESSION: No pneumothorax following central line placement.  Line and tube positions as  above.  Bibasilar atelectasis and questionable small pleural effusions.   Electronically Signed   By: Ulyses Southward M.D.   On: 30-Jul-2015 23:56   Dg Chest Port 1 View  2015/07/30   CLINICAL DATA:  Central line placement.  EXAM: PORTABLE CHEST - 1 VIEW  COMPARISON:  July 30, 2015  FINDINGS: Endotracheal tube is in place with tip 7.4 cm above carina. Nasogastric tube is in place, tip beyond the imaged and beyond the gastroesophageal junction. Left IJ central line has been placed, tip  overlying the level of superior vena cava.  The heart is enlarged. There is bibasilar opacity, obscuring the medial aspect of the hemidiaphragms bilaterally. There is no evidence for pneumothorax.  IMPRESSION: 1. Interval placement of left IJ central line. 2. Interval placement of nasogastric tube, tip off the film. 3. No evidence for postprocedure pneumothorax.   Electronically Signed   By: Norva Pavlov M.D.   On: 07-30-2015 17:03   Dg Chest Port 1 View  2015-07-30   CLINICAL DATA:  59 year old male with pneumonia. Endotracheal tube placement.  EXAM: PORTABLE CHEST - 1 VIEW  COMPARISON:  None  FINDINGS: An endotracheal tube is present with tip 7.2 cm above the carina.  Upper limits normal heart size noted.  Bibasilar atelectasis/airspace disease and possible small pleural effusions noted.  There is no evidence of pneumothorax or pulmonary edema  No acute bony abnormalities are identified. Remote right-sided rib fractures are present.  IMPRESSION: Upper limits normal heart size with bibasilar atelectasis/airspace disease possible small pleural effusions.  Endotracheal tube with tip 7.2 cm above carina. Consider 1-2 cm advancement.   Electronically Signed   By: Harmon Pier M.D.   On: July 30, 2015 14:07     Assessment/Plan: Sepsis He continues to be severely ill Would leave him on isolation for 48h (for possibility that this is mening).  Otherwise out of isolation since respiratory panel is (-).  No change in anbx Await his BCx, respiratory Cx  VDRF Continues to be severely ill  ARF Slightly better  Shock liver Will continue to watch his LFTs  Coagulopathy Will continue to watch his INR, plt.  Supplement  As needed.  Severe protein calorie malnutrition Appreciate nutrition eval.   Total days of antibiotics: 2 clinda/doxy/merrem/vanco         Johny Sax Infectious Diseases (pager) [phone] [email or web address] 07/06/2015, 7:48 AM  LOS: 2 days

## 2015-07-07 LAB — TYPE AND SCREEN
ABO/RH(D): O POS
ANTIBODY SCREEN: POSITIVE
DAT, IgG: NEGATIVE
Donor AG Type: NEGATIVE
Donor AG Type: NEGATIVE
PT AG Type: NEGATIVE
UNIT DIVISION: 0
UNIT DIVISION: 0

## 2015-07-07 LAB — LEGIONELLA ANTIGEN, URINE

## 2015-07-07 LAB — HEPATITIS PANEL, ACUTE
HCV Ab: 0.1 s/co ratio (ref 0.0–0.9)
HEP B C IGM: NEGATIVE
HEP B S AG: NEGATIVE
Hep A IgM: NEGATIVE

## 2015-07-07 LAB — GLUCOSE, CAPILLARY: Glucose-Capillary: 291 mg/dL — ABNORMAL HIGH (ref 65–99)

## 2015-07-07 LAB — HIV ANTIBODY (ROUTINE TESTING W REFLEX): HIV Screen 4th Generation wRfx: NONREACTIVE

## 2015-07-10 LAB — CULTURE, BLOOD (ROUTINE X 2): Culture: NO GROWTH

## 2015-07-11 ENCOUNTER — Telehealth: Payer: Self-pay

## 2015-07-11 NOTE — Telephone Encounter (Signed)
On 07/11/2015 I received a death certificate from [facility] and [employer]. The death certificate is for burial. The patient is a patient of Doctor Tyson Alias. The death certificate will be taken to [facility] this pm for signature. On 2015-08-10 I received the death certificate back from Doctor Tyson Alias. I got the death certificate ready and mailed the death certificate to the [employer] Dept per their request.

## 2015-07-16 NOTE — Progress Notes (Signed)
06/20/2015 0110 Pt DNR. Pt a line flat. No pulses palpable. No cardiac sounds. Pupils fixed and dilated.  RNs Ashley Jacobs and Wilson Singer pronouced time of death at 0110. Family at bedside. Offered Support. Will continue to monitor.

## 2015-07-16 NOTE — Progress Notes (Signed)
RT extubated patient at 0120 per extubation order.

## 2015-07-16 NOTE — Progress Notes (Signed)
eLink Physician-Brief Progress Note Patient Name: Johnny Harris DOB: [birth year] MRN: [id number]   Date of Service  06/26/2015  HPI/Events of Note  Pt is deceased  eICU Interventions  RN to pronounce. Extubation order placed. All other orders stopped     Intervention Category Major Interventions: Other:  Billy Fischer 07/06/2015, 1:09 AM

## 2015-07-16 DEATH — deceased

## 2015-08-16 NOTE — Discharge Summary (Signed)
Johnny Harris, Johnny Harris              ACCOUNT NO.:  [id number]  MEDICAL RECORD NO.:  [id number]  LOCATION:  2M11C                        FACILITY:  [facility]  PHYSICIAN:  Nelda Bucksaniel J Feinstein, MD DATE OF BIRTH:  12-Feb-1956  DATE OF ADMISSION:  03-16-15 DATE OF DISCHARGE:  07/13/2015                              DISCHARGE SUMMARY   DEATH SUMMARY:  HISTORY OF PRESENT ILLNESS:  This is a 59 year old male, initially presented to [facility] on July 03, 2015, with mental status changes.  The patient was admitted with worsening and diagnosis of septic shock in the setting of presumed pneumonia.  The patient developed worsening LOC, respiratory failure, acidosis, renal failure, multiorgan dysfunction syndrome, was transferred to [facility], intubated on arrival.  WORKING DIAGNOSIS:  Community-acquired pneumonia, respiratory failure, septic shock, toxic shock syndrome, and multiorgan dysfunction syndrome including thrombocytopenia.  Chest CT on July 04, 2015, showed bilateral infiltrates, right side greater than left.  CT scan of the abdomen and pelvis is negative for obstruction.  No other acute finding except for delayed renal nephrograms raising the concern about either poor renal function or  pyelonephritis echocardiogram on July 05, 2015, showed ejection fraction 25% to 30% with no vegetations, grade 3 diastolic dysfunction, akinesis of the entire inferior myocardium. Significant events on July 04, 2015, including CVVHD initiation with refractory acidosis on July 05, 2015, still requiring significant doses of pressors, thrombocytopenia.  IVIG was started for concerns of toxic shock syndrome with an incredible diffuse impressive models, rash appearance of the skin extending all way to the neck and to be top of his nose including all the way down to his lower extremities with edema associated.  Unfortunately, this patient continued to  have multiorgan dysfunction syndrome and circling refractory course of continued failure despite aggressive measures to point where he had a total likely lower extremity ischemia and family did not want nor do was he a candidate for that time for any kind of therapy in terms of reperfusion.  He continued refractory lactic acidosis likely associated with some shock liver, ischemic bowel concerns, and was deemed for comfort care consideration, and the patient expired.  To note, he had an echocardiogram, which was negative for vegetation likely pneumonia as a source with again toxic shock was treated aggressively with clindamycin, meropenem, doxycycline, atypical coverage vancomycin, IVIG.  ID consultation was involved as well.  The family understood that this is not survivable, and the patient expired.  FINAL DIAGNOSES UPON DEATH: 1. Toxic shock syndrome. 2. Septic shock. 3. Acute respiratory failure. 4. 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 5. Shock liver. 6. Acute renal failure.     Nelda Bucksaniel J Feinstein, MD     DJF/MEDQ  D:  08/08/2015  T:  08/09/2015  Job:  [id number]

## 2017-01-25 IMAGING — CR DG CHEST 1V PORT
2 series · 2 of 2 positions shown · non-contrast
Comparison: 07/04/2015.

CLINICAL DATA: Pneumonia.  Shortness of breath.

EXAM:
PORTABLE CHEST - 1 VIEW

[AP (1 of 2)]
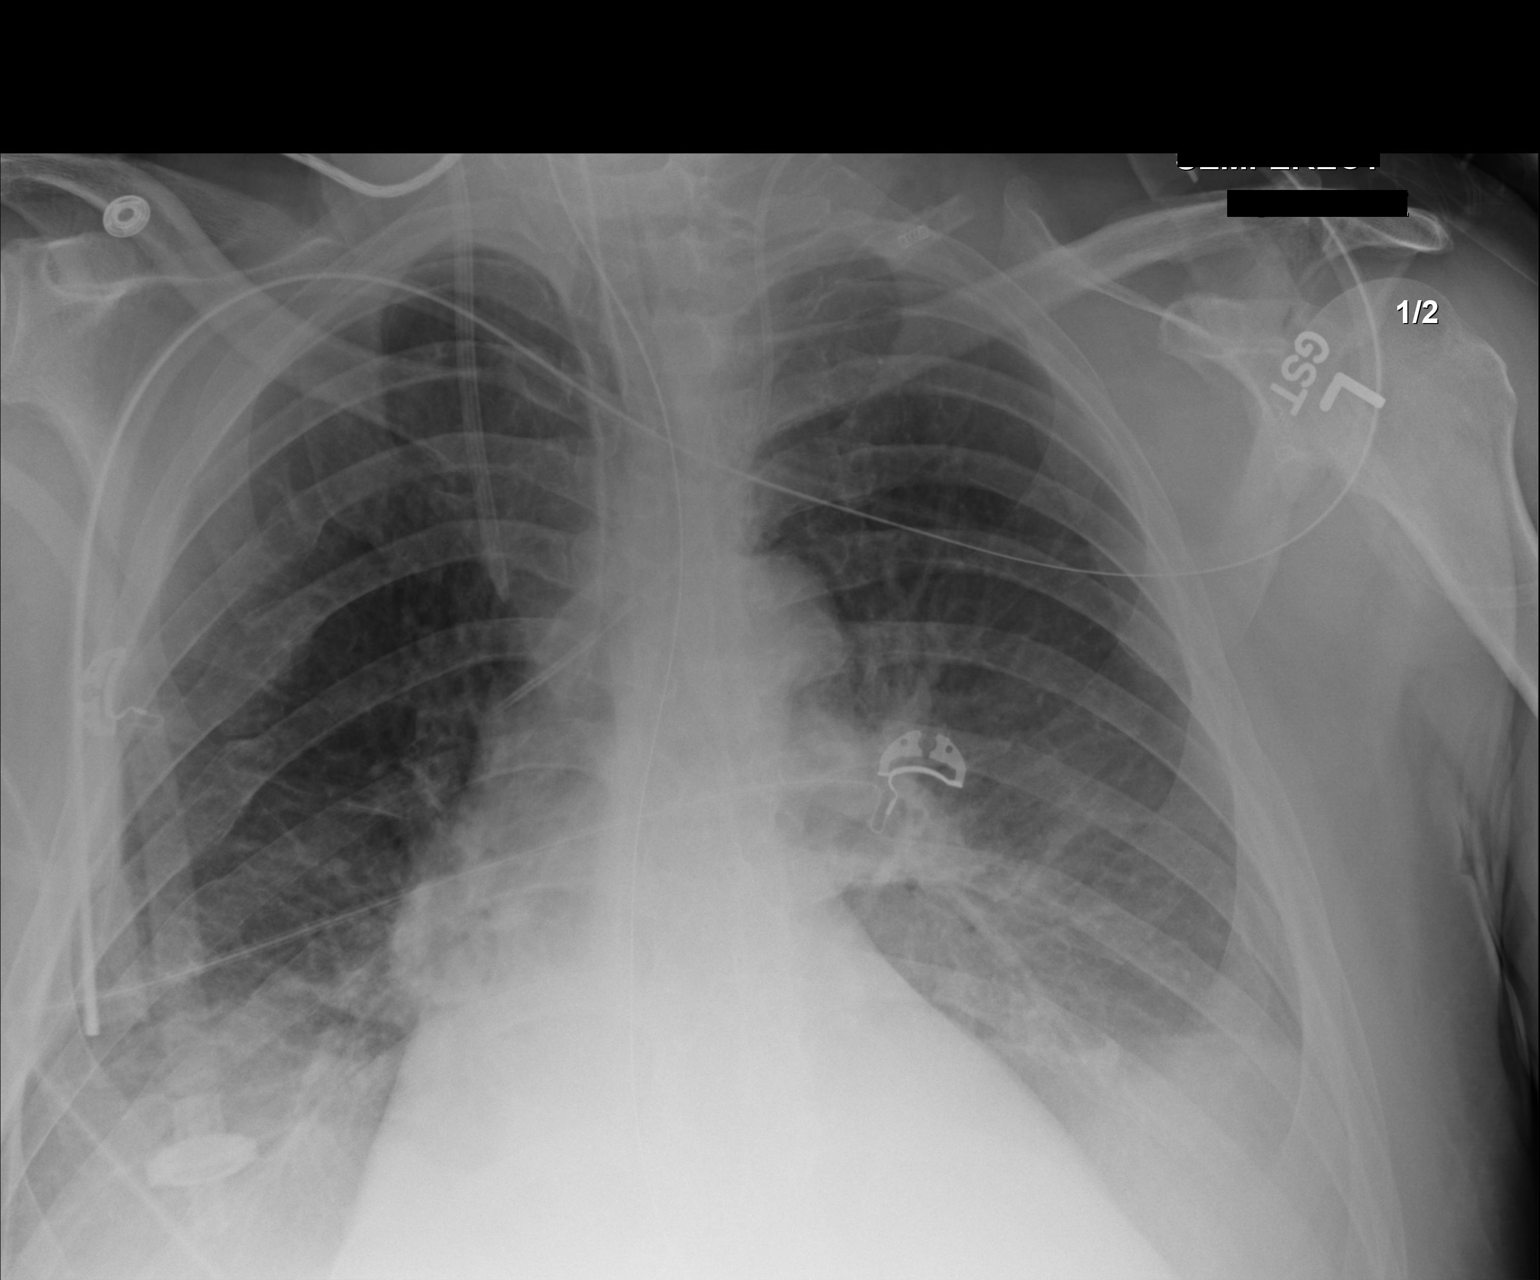

[AP (2 of 2)]
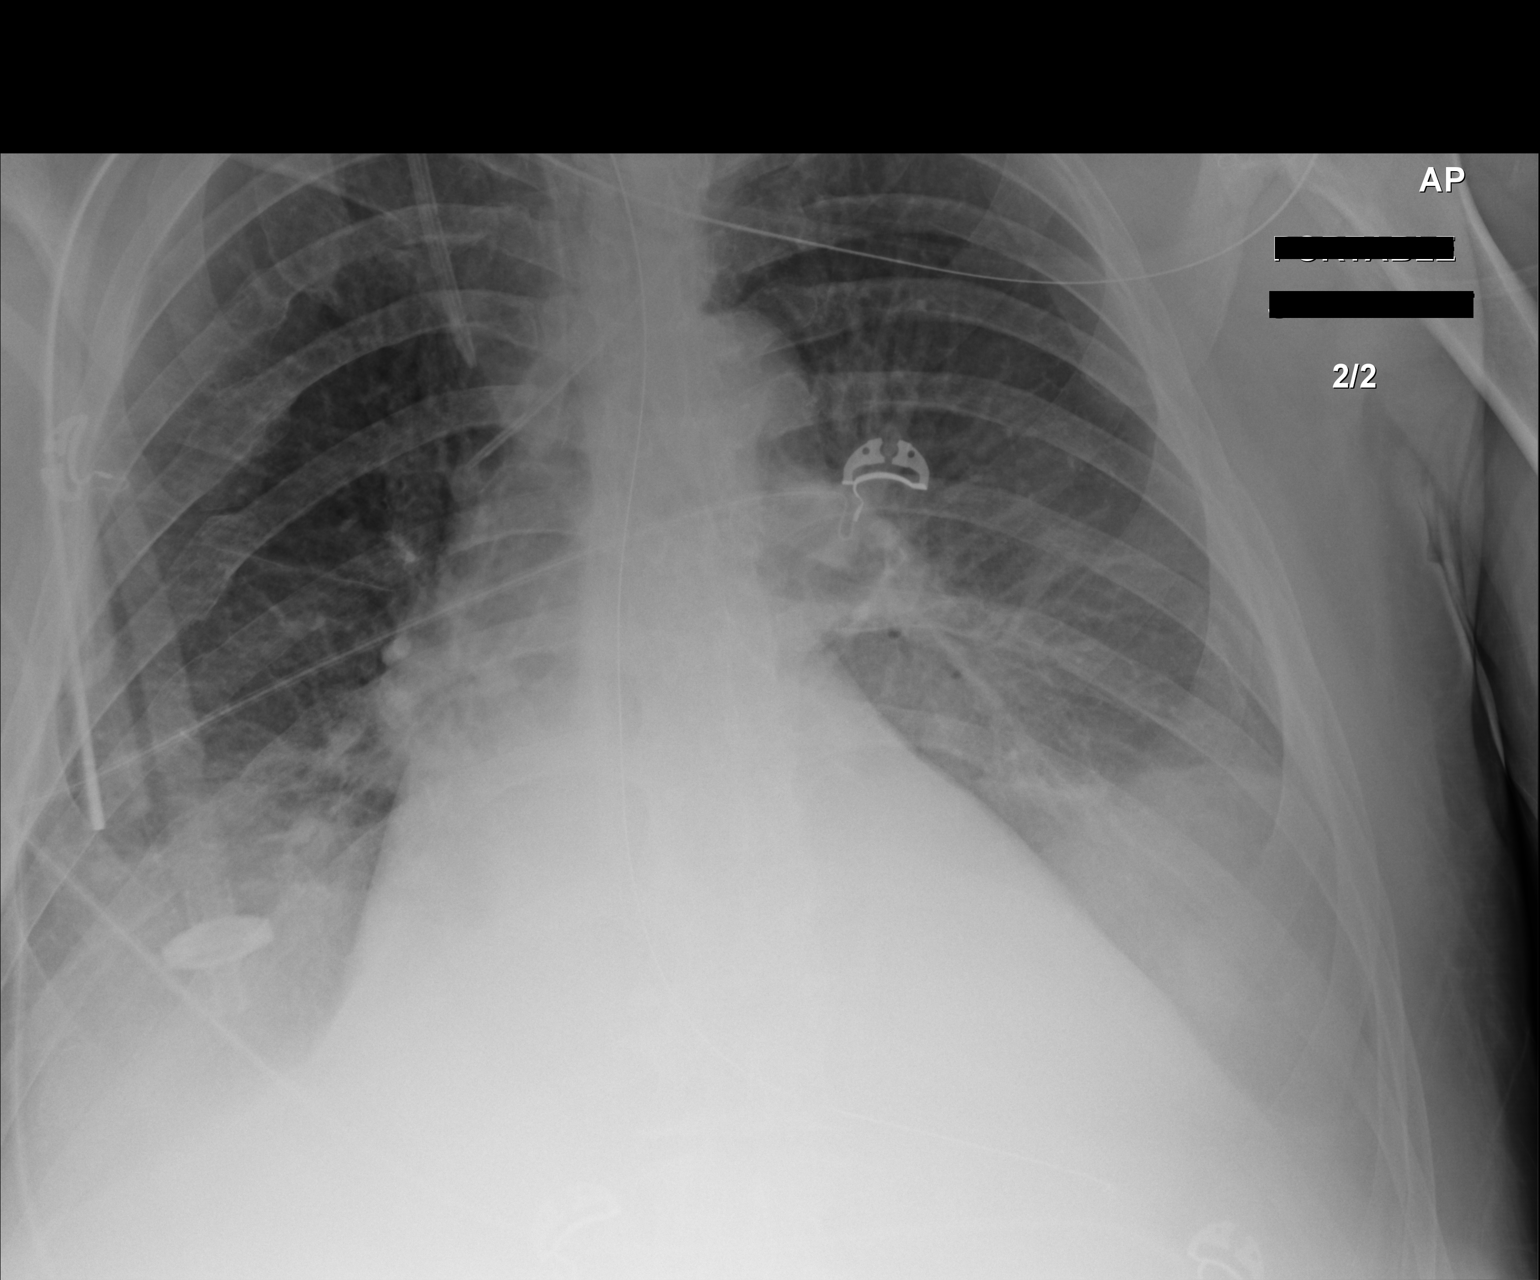

[2 of 2 positions shown; findings below may reference images not displayed]

FINDINGS: Endotracheal tube, NG tube, bilateral IJ lines in stable position.
Stable cardiomegaly. Slight progression of bibasilar atelectasis
and/or infiltrates. Small pleural effusions cannot be completely
excluded. No pneumothorax.
IMPRESSION: 1. Lines and tubes in stable position.
2. Slight progression of bibasilar atelectasis and/or infiltrates.
Small pleural effusions cannot be excluded.
3. Stable cardiomegaly.

## 2017-01-26 IMAGING — CR DG CHEST 1V PORT
2 series · 2 of 2 positions shown · non-contrast
Comparison: 07/05/2015

CLINICAL DATA: subsequent encounter pneumonia

EXAM:
PORTABLE CHEST - 1 VIEW

[AP (1 of 2)]
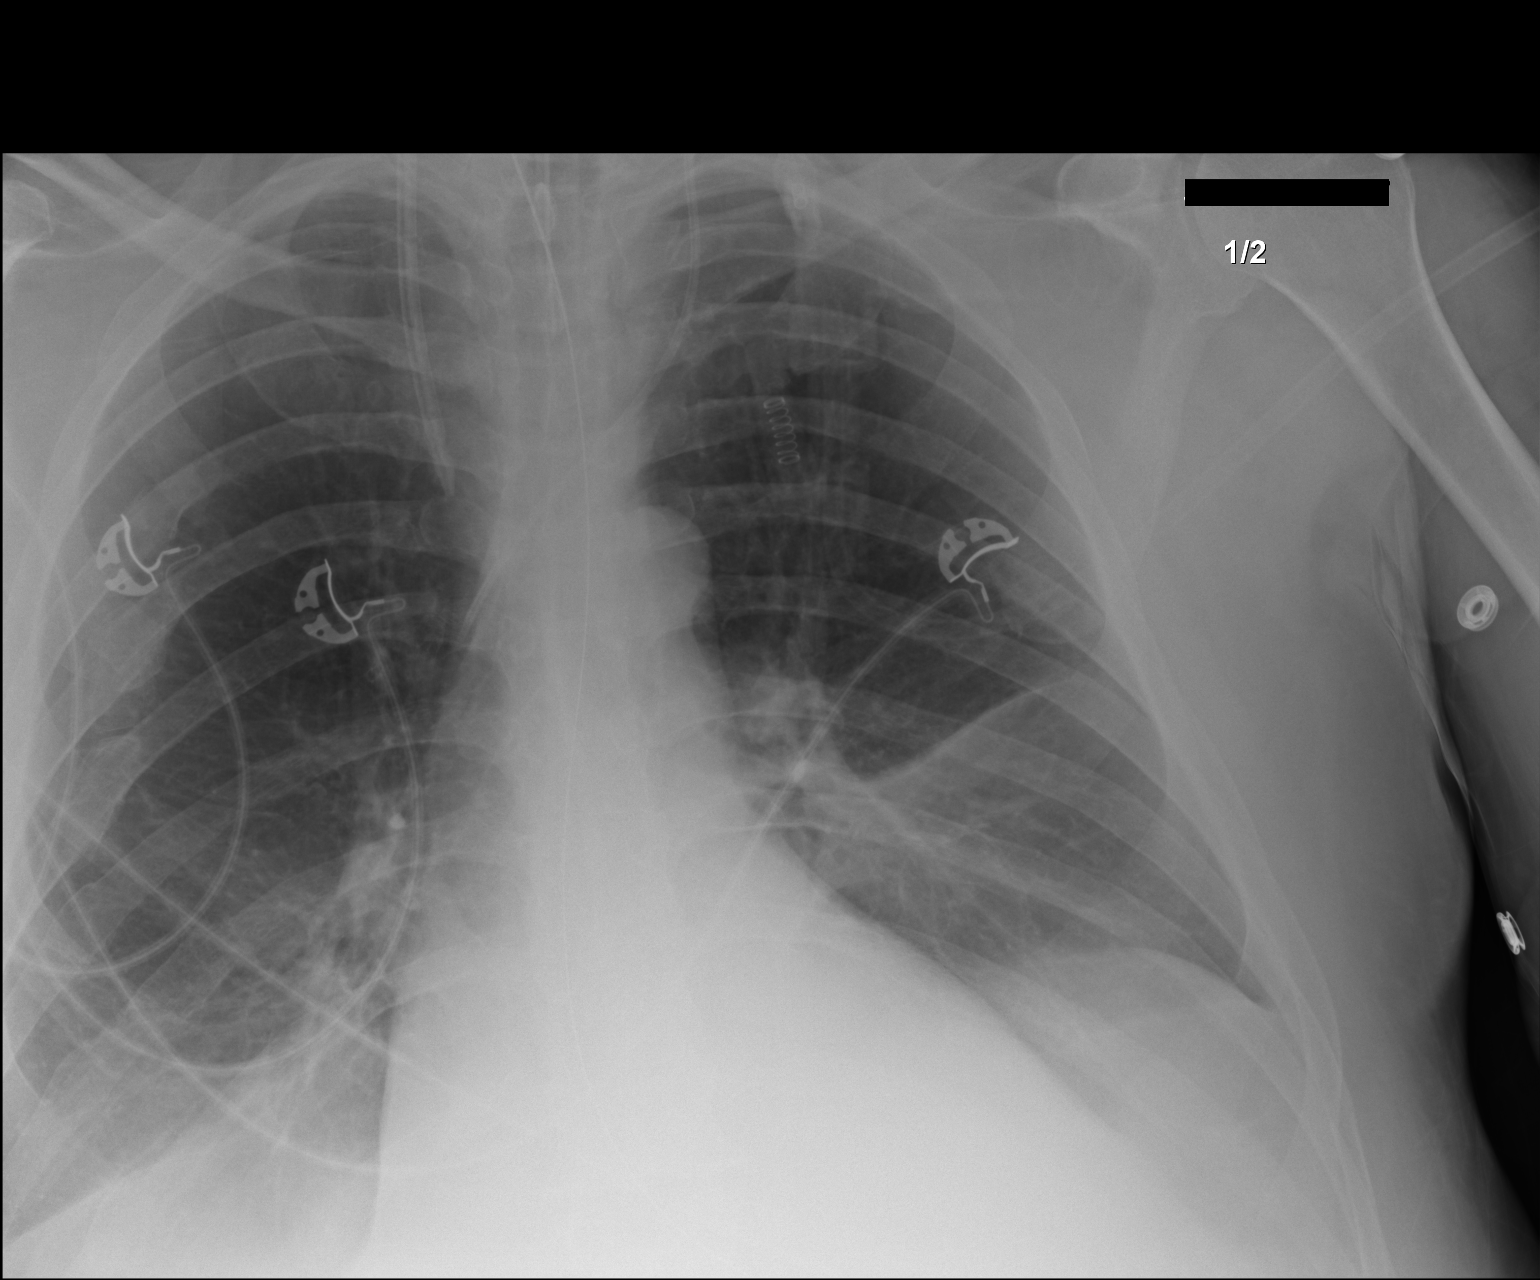

[AP (2 of 2)]
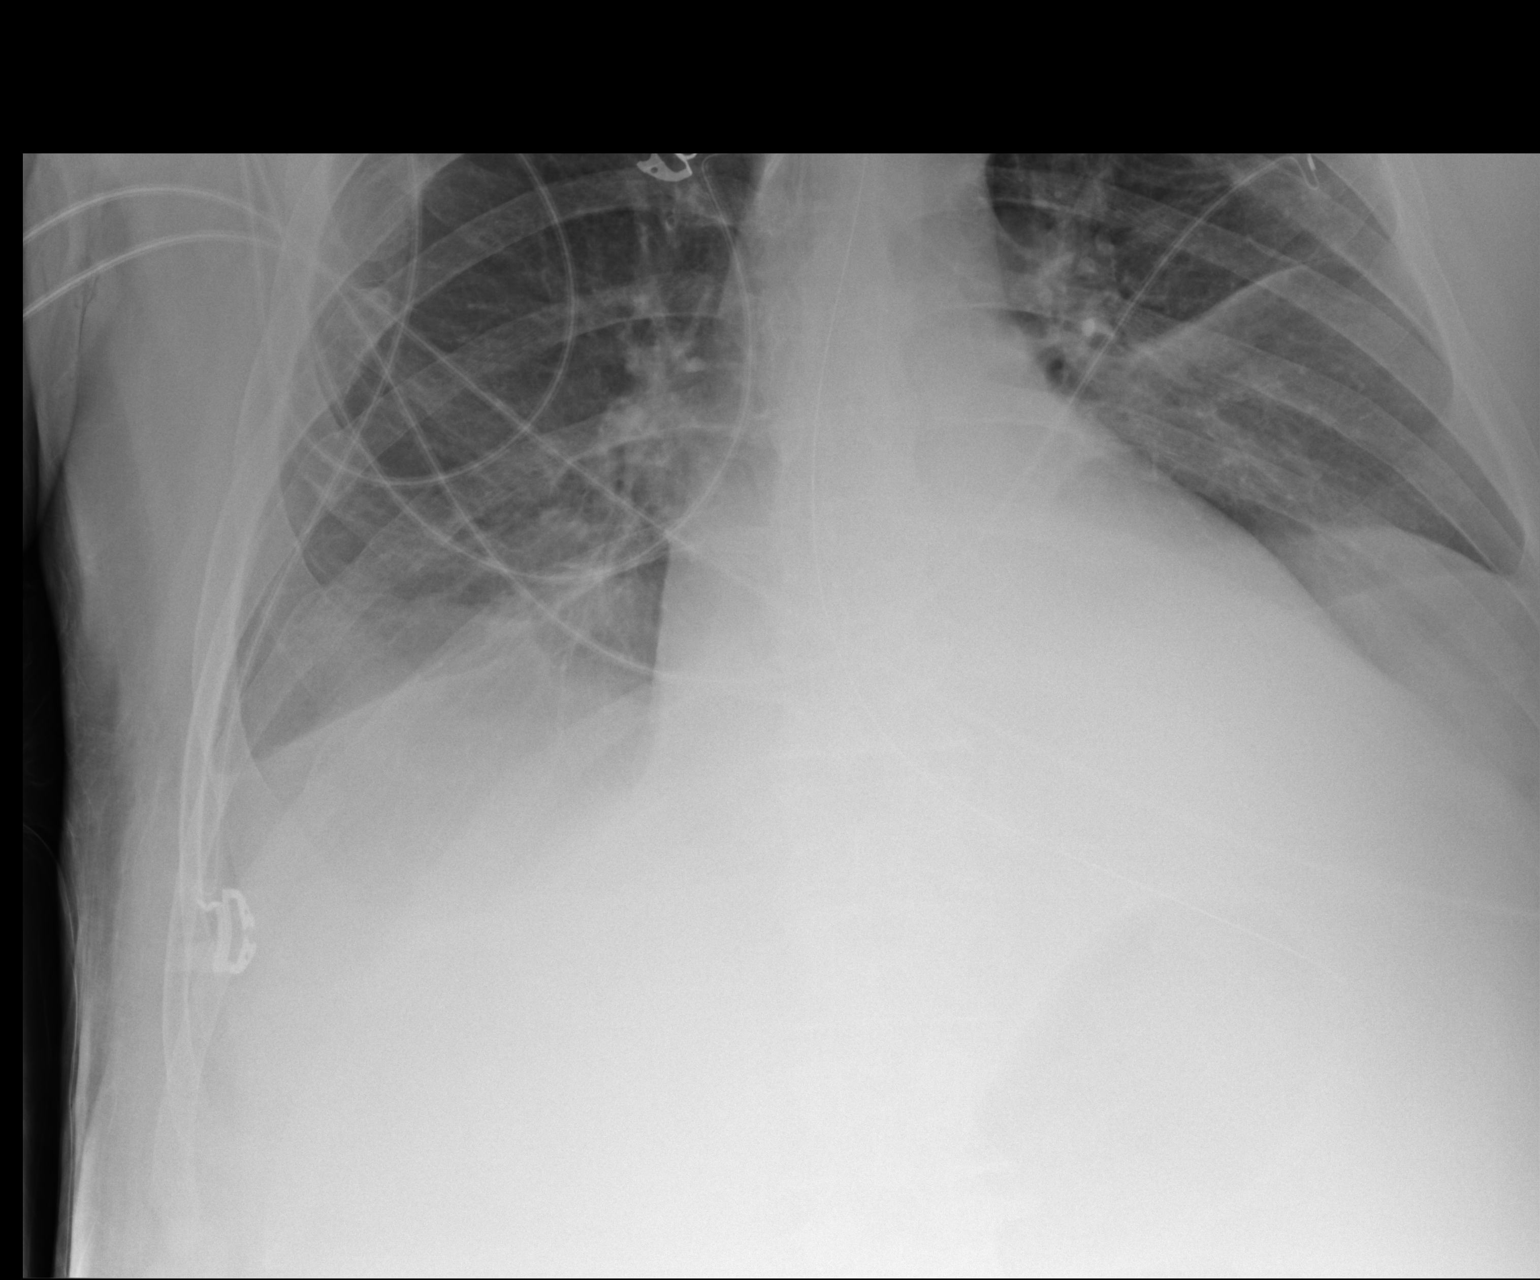

[2 of 2 positions shown; findings below may reference images not displayed]

FINDINGS: Hazy density at both lung bases unchanged suggests a combination of
consolidation or pleural effusion. Lines and tubes unchanged. Stable
mild cardiac enlargement.
IMPRESSION: Stable bibasilar opacities
